# Patient Record
Sex: Male | Born: 1950
Health system: Southern US, Community
[De-identification: ages and names within clinical notes are randomized; demographics above are authoritative.]

## PROBLEM LIST (undated history)

## (undated) DIAGNOSIS — I10 Essential (primary) hypertension: Secondary | ICD-10-CM

## (undated) DIAGNOSIS — N189 Chronic kidney disease, unspecified: Secondary | ICD-10-CM

## (undated) DIAGNOSIS — M1990 Unspecified osteoarthritis, unspecified site: Secondary | ICD-10-CM

## (undated) DIAGNOSIS — G4730 Sleep apnea, unspecified: Secondary | ICD-10-CM

## (undated) HISTORY — PX: COLONOSCOPY: SHX174

---

## 2009-12-05 ENCOUNTER — Encounter: Admission: RE | Admit: 2009-12-05 | Discharge: 2009-12-05 | Payer: Self-pay | Admitting: Family Medicine

## 2013-11-06 ENCOUNTER — Ambulatory Visit (HOSPITAL_BASED_OUTPATIENT_CLINIC_OR_DEPARTMENT_OTHER): Payer: BC Managed Care – PPO | Attending: Family Medicine

## 2013-11-06 VITALS — Ht 70.0 in | Wt 180.0 lb

## 2013-11-06 DIAGNOSIS — G4733 Obstructive sleep apnea (adult) (pediatric): Secondary | ICD-10-CM | POA: Diagnosis present

## 2013-11-12 DIAGNOSIS — G4733 Obstructive sleep apnea (adult) (pediatric): Secondary | ICD-10-CM

## 2013-11-12 NOTE — Sleep Study (Signed)
   NAME: Ryan Rubio DATE OF BIRTH:  04-18-1950 MEDICAL RECORD NUMBER [id number]  LOCATION: [facility]  PHYSICIAN: YOUNG,CLINTON D  DATE OF STUDY: 11/06/2013  SLEEP STUDY TYPE: Nocturnal Polysomnogram               REFERRING PHYSICIAN: Marjorie Smolder, MD  INDICATION FOR STUDY: Insomnia with sleep apnea  EPWORTH SLEEPINESS SCORE:   11/24  HEIGHT: 5\' 10"  (177.8 cm)  WEIGHT: 81.647 kg (180 lb)    Body mass index is 25.83 kg/(m^2).  NECK SIZE: 15 in.  MEDICATIONS: Charted for review  SLEEP ARCHITECTURE: Split study protocol. During the diagnostic phase, total sleep time 120.5 minutes with sleep efficiency 78.2%. Stage I was 6.2%, stage II 75.1%, stage III absent, REM 18.7% of total sleep time. Sleep latency 6.5 minutes, REM latency 125 minutes, awake after sleep onset 27 minutes, arousal index 4.5, bedtime medication: None  RESPIRATORY DATA: Apnea hypopneas index (AHI) 37.3 per hour. 75 total events scored including 23 obstructive apneas, 2 central apneas, 50 hypopneas. Events were associated with supine sleep position and REM AHI 74.7 per hour. CPAP titration to 14 CWP, AHI 3.7 per hour. He wore a medium fullface mask.  OXYGEN DATA: Before CPAP snoring was loud with oxygen desaturation to a nadir of 72% on room air. With CPAP control, snoring was prevented and mean oxygen sat 94.7% on room air.  CARDIAC DATA: Sinus rhythm  MOVEMENT/PARASOMNIA: Limb jerks were noted before and after CPAP titration, but had little apparent effect on sleep,  bathroom x2  IMPRESSION/ RECOMMENDATION:   1) Severe obstructive sleep apnea/hypopneas syndrome, AHI 37.3 per hour with supine events. Loud snoring with oxygen desaturation to a nadir of 72% on room air. 2) Successful CPAP titration to 14 CWP, AHI 3.7 per hour. He wore a medium [employer] Simplus fullface mask with heated humidifier. Snoring was prevented and mean oxygen saturation was 94.7% on room air.    Ryan Rubio Diplomate, American Board of Sleep Medicine  ELECTRONICALLY SIGNED ON:  11/12/2013, 2:27 PM [place] PH: [phone]   FX: [phone] [place]

## 2014-09-04 DIAGNOSIS — N5201 Erectile dysfunction due to arterial insufficiency: Secondary | ICD-10-CM | POA: Diagnosis not present

## 2014-09-04 DIAGNOSIS — Z23 Encounter for immunization: Secondary | ICD-10-CM | POA: Diagnosis not present

## 2014-09-04 DIAGNOSIS — G4733 Obstructive sleep apnea (adult) (pediatric): Secondary | ICD-10-CM | POA: Diagnosis not present

## 2014-09-04 DIAGNOSIS — Z131 Encounter for screening for diabetes mellitus: Secondary | ICD-10-CM | POA: Diagnosis not present

## 2014-09-04 DIAGNOSIS — E785 Hyperlipidemia, unspecified: Secondary | ICD-10-CM | POA: Diagnosis not present

## 2014-09-04 DIAGNOSIS — Z0000 Encounter for general adult medical examination without abnormal findings: Secondary | ICD-10-CM | POA: Diagnosis not present

## 2014-09-06 ENCOUNTER — Other Ambulatory Visit: Payer: Self-pay | Admitting: Family Medicine

## 2014-09-06 DIAGNOSIS — Z0000 Encounter for general adult medical examination without abnormal findings: Secondary | ICD-10-CM

## 2014-09-18 ENCOUNTER — Ambulatory Visit
Admission: RE | Admit: 2014-09-18 | Discharge: 2014-09-18 | Disposition: A | Discharge status: Home / Self Care | Payer: Medicare Other | Source: Ambulatory Visit | Attending: Family Medicine | Admitting: Family Medicine

## 2014-09-18 DIAGNOSIS — Z87891 Personal history of nicotine dependence: Secondary | ICD-10-CM | POA: Diagnosis not present

## 2014-09-18 DIAGNOSIS — Z0000 Encounter for general adult medical examination without abnormal findings: Secondary | ICD-10-CM

## 2014-09-18 DIAGNOSIS — F17211 Nicotine dependence, cigarettes, in remission: Secondary | ICD-10-CM | POA: Diagnosis not present

## 2014-09-18 DIAGNOSIS — Z136 Encounter for screening for cardiovascular disorders: Secondary | ICD-10-CM | POA: Diagnosis not present

## 2014-10-19 DIAGNOSIS — M9903 Segmental and somatic dysfunction of lumbar region: Secondary | ICD-10-CM | POA: Diagnosis not present

## 2014-10-19 DIAGNOSIS — M5441 Lumbago with sciatica, right side: Secondary | ICD-10-CM | POA: Diagnosis not present

## 2014-10-24 DIAGNOSIS — M5441 Lumbago with sciatica, right side: Secondary | ICD-10-CM | POA: Diagnosis not present

## 2014-10-24 DIAGNOSIS — M9903 Segmental and somatic dysfunction of lumbar region: Secondary | ICD-10-CM | POA: Diagnosis not present

## 2014-10-26 DIAGNOSIS — M5441 Lumbago with sciatica, right side: Secondary | ICD-10-CM | POA: Diagnosis not present

## 2014-10-26 DIAGNOSIS — M9903 Segmental and somatic dysfunction of lumbar region: Secondary | ICD-10-CM | POA: Diagnosis not present

## 2014-10-31 DIAGNOSIS — M9903 Segmental and somatic dysfunction of lumbar region: Secondary | ICD-10-CM | POA: Diagnosis not present

## 2014-10-31 DIAGNOSIS — M5441 Lumbago with sciatica, right side: Secondary | ICD-10-CM | POA: Diagnosis not present

## 2014-11-02 DIAGNOSIS — M9903 Segmental and somatic dysfunction of lumbar region: Secondary | ICD-10-CM | POA: Diagnosis not present

## 2014-11-02 DIAGNOSIS — M5441 Lumbago with sciatica, right side: Secondary | ICD-10-CM | POA: Diagnosis not present

## 2014-11-06 DIAGNOSIS — M9903 Segmental and somatic dysfunction of lumbar region: Secondary | ICD-10-CM | POA: Diagnosis not present

## 2014-11-06 DIAGNOSIS — M5441 Lumbago with sciatica, right side: Secondary | ICD-10-CM | POA: Diagnosis not present

## 2014-11-09 DIAGNOSIS — M9903 Segmental and somatic dysfunction of lumbar region: Secondary | ICD-10-CM | POA: Diagnosis not present

## 2014-11-09 DIAGNOSIS — M5441 Lumbago with sciatica, right side: Secondary | ICD-10-CM | POA: Diagnosis not present

## 2014-11-23 DIAGNOSIS — M1711 Unilateral primary osteoarthritis, right knee: Secondary | ICD-10-CM | POA: Diagnosis not present

## 2014-12-14 DIAGNOSIS — E7800 Pure hypercholesterolemia, unspecified: Secondary | ICD-10-CM | POA: Diagnosis not present

## 2014-12-14 DIAGNOSIS — R7301 Impaired fasting glucose: Secondary | ICD-10-CM | POA: Diagnosis not present

## 2014-12-22 DIAGNOSIS — M1711 Unilateral primary osteoarthritis, right knee: Secondary | ICD-10-CM | POA: Diagnosis not present

## 2015-03-01 DIAGNOSIS — M170 Bilateral primary osteoarthritis of knee: Secondary | ICD-10-CM | POA: Diagnosis not present

## 2015-03-01 DIAGNOSIS — M1712 Unilateral primary osteoarthritis, left knee: Secondary | ICD-10-CM | POA: Diagnosis not present

## 2015-03-06 DIAGNOSIS — E7800 Pure hypercholesterolemia, unspecified: Secondary | ICD-10-CM | POA: Diagnosis not present

## 2015-04-02 DIAGNOSIS — M170 Bilateral primary osteoarthritis of knee: Secondary | ICD-10-CM | POA: Diagnosis not present

## 2015-07-17 DIAGNOSIS — M1711 Unilateral primary osteoarthritis, right knee: Secondary | ICD-10-CM | POA: Diagnosis not present

## 2015-07-23 DIAGNOSIS — M1711 Unilateral primary osteoarthritis, right knee: Secondary | ICD-10-CM | POA: Diagnosis not present

## 2015-08-01 DIAGNOSIS — M1711 Unilateral primary osteoarthritis, right knee: Secondary | ICD-10-CM | POA: Diagnosis not present

## 2015-09-12 DIAGNOSIS — M1711 Unilateral primary osteoarthritis, right knee: Secondary | ICD-10-CM | POA: Diagnosis not present

## 2015-11-22 DIAGNOSIS — E7800 Pure hypercholesterolemia, unspecified: Secondary | ICD-10-CM | POA: Diagnosis not present

## 2015-11-22 DIAGNOSIS — Z23 Encounter for immunization: Secondary | ICD-10-CM | POA: Diagnosis not present

## 2015-11-22 DIAGNOSIS — G4730 Sleep apnea, unspecified: Secondary | ICD-10-CM | POA: Diagnosis not present

## 2015-11-22 DIAGNOSIS — N5201 Erectile dysfunction due to arterial insufficiency: Secondary | ICD-10-CM | POA: Diagnosis not present

## 2015-11-22 DIAGNOSIS — Z125 Encounter for screening for malignant neoplasm of prostate: Secondary | ICD-10-CM | POA: Diagnosis not present

## 2015-11-22 DIAGNOSIS — Z5181 Encounter for therapeutic drug level monitoring: Secondary | ICD-10-CM | POA: Diagnosis not present

## 2015-11-22 DIAGNOSIS — M170 Bilateral primary osteoarthritis of knee: Secondary | ICD-10-CM | POA: Diagnosis not present

## 2015-12-28 DIAGNOSIS — R740 Nonspecific elevation of levels of transaminase and lactic acid dehydrogenase [LDH]: Secondary | ICD-10-CM | POA: Diagnosis not present

## 2015-12-28 DIAGNOSIS — E7800 Pure hypercholesterolemia, unspecified: Secondary | ICD-10-CM | POA: Diagnosis not present

## 2015-12-28 DIAGNOSIS — R7301 Impaired fasting glucose: Secondary | ICD-10-CM | POA: Diagnosis not present

## 2016-01-02 DIAGNOSIS — G4733 Obstructive sleep apnea (adult) (pediatric): Secondary | ICD-10-CM | POA: Diagnosis not present

## 2016-01-07 DIAGNOSIS — H903 Sensorineural hearing loss, bilateral: Secondary | ICD-10-CM | POA: Diagnosis not present

## 2016-02-20 DIAGNOSIS — H26059 Posterior subcapsular polar infantile and juvenile cataract, unspecified eye: Secondary | ICD-10-CM | POA: Diagnosis not present

## 2016-03-12 DIAGNOSIS — M1711 Unilateral primary osteoarthritis, right knee: Secondary | ICD-10-CM | POA: Diagnosis not present

## 2016-03-17 DIAGNOSIS — Z01818 Encounter for other preprocedural examination: Secondary | ICD-10-CM | POA: Diagnosis not present

## 2016-03-17 DIAGNOSIS — M1710 Unilateral primary osteoarthritis, unspecified knee: Secondary | ICD-10-CM | POA: Diagnosis not present

## 2016-03-21 ENCOUNTER — Ambulatory Visit: Payer: Self-pay | Admitting: Orthopedic Surgery

## 2016-03-25 DIAGNOSIS — M1711 Unilateral primary osteoarthritis, right knee: Secondary | ICD-10-CM | POA: Diagnosis not present

## 2016-03-31 NOTE — Patient Instructions (Signed)
Ryan Rubio  03/31/2016   Your procedure is scheduled on: 04/11/2016    Report to [facility] Main  Entrance take [place]  elevators to 3rd floor to  [place] at    [place] AM.  Call this number if you have problems the morning of surgery [phone]   Remember: ONLY 1 PERSON MAY GO WITH YOU TO SHORT STAY TO GET  READY MORNING OF [place].  Do not eat food or drink liquids :After Midnight.     Take these medicines the morning of surgery with A SIP OF WATER: none                                 You may not have any metal on your body including hair pins and              piercings  Do not wear jewelry,  lotions, powders or perfumes, deodorant                         Men may shave face and neck.   Do not bring valuables to the hospital. [place].  Contacts, dentures or bridgework may not be worn into surgery.  Leave suitcase in the car. After surgery it may be brought to your room.                     Please read over the following fact sheets you were given: _____________________________________________________________________             [address] [facility] - Preparing for Surgery Before surgery, you can play an important role.  Because skin is not sterile, your skin needs to be as free of germs as possible.  You can reduce the number of germs on your skin by washing with CHG (chlorahexidine gluconate) soap before surgery.  CHG is an antiseptic cleaner which kills germs and bonds with the skin to continue killing germs even after washing. Please DO NOT use if you have an allergy to CHG or antibacterial soaps.  If your skin becomes reddened/irritated stop using the CHG and inform your nurse when you arrive at Short Stay. Do not shave (including legs and underarms) for at least 48 hours prior to the first CHG shower.  You may shave your face/neck. Please follow these instructions carefully:  1.  Shower  with CHG Soap the night before surgery and the  morning of Surgery.  2.  If you choose to wash your hair, wash your hair first as usual with your  normal  shampoo.  3.  After you shampoo, rinse your hair and body thoroughly to remove the  shampoo.                           4.  Use CHG as you would any other liquid soap.  You can apply chg directly  to the skin and wash                       Gently with a scrungie or clean washcloth.  5.  Apply the CHG Soap to your body ONLY FROM THE NECK DOWN.  Do not use on face/ open                           Wound or open sores. Avoid contact with eyes, ears mouth and genitals (private parts).                       Wash face,  Genitals (private parts) with your normal soap.             6.  Wash thoroughly, paying special attention to the area where your surgery  will be performed.  7.  Thoroughly rinse your body with warm water from the neck down.  8.  DO NOT shower/wash with your normal soap after using and rinsing off  the CHG Soap.                9.  Pat yourself dry with a clean towel.            10.  Wear clean pajamas.            11.  Place clean sheets on your bed the night of your first shower and do not  sleep with pets. Day of Surgery : Do not apply any lotions/deodorants the morning of surgery.  Please wear clean clothes to the hospital/surgery center.  FAILURE TO FOLLOW THESE INSTRUCTIONS MAY RESULT IN THE CANCELLATION OF YOUR SURGERY PATIENT SIGNATURE_________________________________  NURSE SIGNATURE__________________________________  ________________________________________________________________________  WHAT IS A BLOOD TRANSFUSION? Blood Transfusion Information  A transfusion is the replacement of blood or some of its parts. Blood is made up of multiple cells which provide different functions.  Red blood cells carry oxygen and are used for blood loss replacement.  White blood cells fight against infection.  Platelets control  bleeding.  Plasma helps clot blood.  Other blood products are available for specialized needs, such as hemophilia or other clotting disorders. BEFORE THE TRANSFUSION  Who gives blood for transfusions?   Healthy volunteers who are fully evaluated to make sure their blood is safe. This is blood bank blood. Transfusion therapy is the safest it has ever been in the practice of medicine. Before blood is taken from a donor, a complete history is taken to make sure that person has no history of diseases nor engages in risky social behavior (examples are intravenous drug use or sexual activity with multiple partners). The donor's travel history is screened to minimize risk of transmitting infections, such as malaria. The donated blood is tested for signs of infectious diseases, such as HIV and hepatitis. The blood is then tested to be sure it is compatible with you in order to minimize the chance of a transfusion reaction. If you or a relative donates blood, this is often done in anticipation of surgery and is not appropriate for emergency situations. It takes many days to process the donated blood. RISKS AND COMPLICATIONS Although transfusion therapy is very safe and saves many lives, the main dangers of transfusion include:   Getting an infectious disease.  Developing a transfusion reaction. This is an allergic reaction to something in the blood you were given. Every precaution is taken to prevent this. The decision to have a blood transfusion has been considered carefully by your caregiver before blood is given. Blood is not given unless the benefits outweigh the risks. AFTER THE TRANSFUSION  Right after receiving a blood transfusion, you will usually feel much better and more energetic. This is especially  true if your red blood cells have gotten low (anemic). The transfusion raises the level of the red blood cells which carry oxygen, and this usually causes an energy increase.  The nurse  administering the transfusion will monitor you carefully for complications. HOME CARE INSTRUCTIONS  No special instructions are needed after a transfusion. You may find your energy is better. Speak with your caregiver about any limitations on activity for underlying diseases you may have. SEEK MEDICAL CARE IF:   Your condition is not improving after your transfusion.  You develop redness or irritation at the intravenous (IV) site. SEEK IMMEDIATE MEDICAL CARE IF:  Any of the following symptoms occur over the next 12 hours:  Shaking chills.  You have a temperature by mouth above 102 F (38.9 C), not controlled by medicine.  Chest, back, or muscle pain.  People around you feel you are not acting correctly or are confused.  Shortness of breath or difficulty breathing.  Dizziness and fainting.  You get a rash or develop hives.  You have a decrease in urine output.  Your urine turns a dark color or changes to pink, red, or brown. Any of the following symptoms occur over the next 10 days:  You have a temperature by mouth above 102 F (38.9 C), not controlled by medicine.  Shortness of breath.  Weakness after normal activity.  The white part of the eye turns yellow (jaundice).  You have a decrease in the amount of urine or are urinating less often.  Your urine turns a dark color or changes to pink, red, or brown. Document Released: 02/01/2000 Document Revised: 04/28/2011 Document Reviewed: 09/20/2007 ExitCare Patient Information 2014 [place].  _______________________________________________________________________  Incentive Spirometer  An incentive spirometer is a tool that can help keep your lungs clear and active. This tool measures how well you are filling your lungs with each breath. Taking long deep breaths may help reverse or decrease the chance of developing breathing (pulmonary) problems (especially infection) following:  A long period of time when you are  unable to move or be active. BEFORE THE PROCEDURE   If the spirometer includes an indicator to show your best effort, your nurse or respiratory therapist will set it to a desired goal.  If possible, sit up straight or lean slightly forward. Try not to slouch.  Hold the incentive spirometer in an upright position. INSTRUCTIONS FOR USE  1. Sit on the edge of your bed if possible, or sit up as far as you can in bed or on a chair. 2. Hold the incentive spirometer in an upright position. 3. Breathe out normally. 4. Place the mouthpiece in your mouth and seal your lips tightly around it. 5. Breathe in slowly and as deeply as possible, raising the piston or the ball toward the top of the column. 6. Hold your breath for 3-5 seconds or for as long as possible. Allow the piston or ball to fall to the bottom of the column. 7. Remove the mouthpiece from your mouth and breathe out normally. 8. Rest for a few seconds and repeat Steps 1 through 7 at least 10 times every 1-2 hours when you are awake. Take your time and take a few normal breaths between deep breaths. 9. The spirometer may include an indicator to show your best effort. Use the indicator as a goal to work toward during each repetition. 10. After each set of 10 deep breaths, practice coughing to be sure your lungs are clear. If you have  an incision (the cut made at the time of surgery), support your incision when coughing by placing a pillow or rolled up towels firmly against it. Once you are able to get out of bed, walk around indoors and cough well. You may stop using the incentive spirometer when instructed by your caregiver.  RISKS AND COMPLICATIONS  Take your time so you do not get dizzy or light-headed.  If you are in pain, you may need to take or ask for pain medication before doing incentive spirometry. It is harder to take a deep breath if you are having pain. AFTER USE  Rest and breathe slowly and easily.  It can be helpful to  keep track of a log of your progress. Your caregiver can provide you with a simple table to help with this. If you are using the spirometer at home, follow these instructions: [place] IF:   You are having difficultly using the spirometer.  You have trouble using the spirometer as often as instructed.  Your pain medication is not giving enough relief while using the spirometer.  You develop fever of 100.5 F (38.1 C) or higher. SEEK IMMEDIATE MEDICAL CARE IF:   You cough up bloody sputum that had not been present before.  You develop fever of 102 F (38.9 C) or greater.  You develop worsening pain at or near the incision site. MAKE SURE YOU:   Understand these instructions.  Will watch your condition.  Will get help right away if you are not doing well or get worse. Document Released: 06/16/2006 Document Revised: 04/28/2011 Document Reviewed: 08/17/2006 [facility] Patient Information 2014 [place], [place].   ________________________________________________________________________

## 2016-03-31 NOTE — H&P (Signed)
TOTAL KNEE ADMISSION H&P  Patient is being admitted for right total knee arthroplasty.  Subjective:  Chief Complaint:right knee pain.  HPI: Ryan Rubio, 66 y.o. male, has a history of pain and functional disability in the right knee due to arthritis and has failed non-surgical conservative treatments for greater than 12 weeks to includecorticosteriod injections, flexibility and strengthening excercises, use of assistive devices, weight reduction as appropriate and activity modification.  Onset of symptoms was gradual, starting 3 years ago with gradually worsening course since that time. The patient noted no past surgery on the right knee(s).  Patient currently rates pain in the right knee(s) at 7 out of 10 with activity. Patient has worsening of pain with activity and weight bearing, pain that interferes with activities of daily living, pain with passive range of motion and joint swelling.  Patient has evidence of subchondral sclerosis and joint space narrowing by imaging studies. There is no active infection.  There are no active problems to display for this patient.  No past medical history on file.  No past surgical history on file.  No prescriptions prior to admission.   No Known Allergies  Social History  Substance Use Topics  . Smoking status: Not on file  . Smokeless tobacco: Not on file  . Alcohol use Not on file    No family history on file.   Review of Systems  Constitutional: Negative.   HENT: Negative.   Eyes: Negative.   Respiratory: Negative.   Cardiovascular: Negative.   Gastrointestinal: Negative.   Genitourinary: Negative.   Musculoskeletal: Positive for joint pain.  Skin: Negative.   Neurological: Negative.   Endo/Heme/Allergies: Negative.   Psychiatric/Behavioral: Negative.     Objective:  Physical Exam  Constitutional: He is oriented to person, place, and time. He appears well-developed.  HENT:  Head: Normocephalic.  Eyes: EOM are normal.  Neck:  Normal range of motion.  Cardiovascular: Normal rate and intact distal pulses.   Respiratory: Effort normal and breath sounds normal.  GI: Soft.  Musculoskeletal:  Good ROM. BLE grossly n/v intact.   Neurological: He is alert and oriented to person, place, and time. He has normal reflexes.  Skin: Skin is warm and dry.  Psychiatric: His behavior is normal.    Vital signs in last 24 hours: BP: ()/()  Arterial Line BP: ()/()   Labs:   Estimated body mass index is 25.83 kg/m as calculated from the following:   Height as of 11/06/13: 5\' 10"  (1.778 m).   Weight as of 11/06/13: 81.6 kg (180 lb).   Imaging Review Plain radiographs demonstrate moderate degenerative joint disease of the right knee(s). The overall alignment ismild varus. The bone quality appears to be good for age and reported activity level.  Assessment/Plan:  End stage arthritis, right knee   The patient history, physical examination, clinical judgment of the provider and imaging studies are consistent with end stage degenerative joint disease of the right knee(s) and total knee arthroplasty is deemed medically necessary. The treatment options including medical management, injection therapy arthroscopy and arthroplasty were discussed at length. The risks and benefits of total knee arthroplasty were presented and reviewed. The risks due to aseptic loosening, infection, stiffness, patella tracking problems, thromboembolic complications and other imponderables were discussed. The patient acknowledged the explanation, agreed to proceed with the plan and consent was signed. Patient is being admitted for inpatient treatment for surgery, pain control, PT, OT, prophylactic antibiotics, VTE prophylaxis, progressive ambulation and ADL's and discharge planning. The patient is  planning to be discharged home with OPPT.  Will use IV tranexamic acid. Contraindications and adverse affects of Tranexamic acid discussed in detail. Patient denies  any of these at this time and understands the risks and benefits.

## 2016-04-01 ENCOUNTER — Encounter (HOSPITAL_COMMUNITY)
Admission: RE | Admit: 2016-04-01 | Discharge: 2016-04-01 | Disposition: A | Discharge status: Home / Self Care | Payer: Medicare Other | Source: Ambulatory Visit | Attending: Specialist | Admitting: Specialist

## 2016-04-01 ENCOUNTER — Encounter (HOSPITAL_COMMUNITY): Payer: Self-pay

## 2016-04-01 DIAGNOSIS — Z01812 Encounter for preprocedural laboratory examination: Secondary | ICD-10-CM | POA: Diagnosis not present

## 2016-04-01 DIAGNOSIS — M1711 Unilateral primary osteoarthritis, right knee: Secondary | ICD-10-CM | POA: Diagnosis not present

## 2016-04-01 HISTORY — DX: Unspecified osteoarthritis, unspecified site: M19.90

## 2016-04-01 HISTORY — DX: Sleep apnea, unspecified: G47.30

## 2016-04-01 LAB — BASIC METABOLIC PANEL
Anion gap: 7 (ref 5–15)
BUN: 16 mg/dL (ref 6–20)
CO2: 26 mmol/L (ref 22–32)
Calcium: 9.8 mg/dL (ref 8.9–10.3)
Chloride: 107 mmol/L (ref 101–111)
Creatinine, Ser: 1.14 mg/dL (ref 0.61–1.24)
GFR calc Af Amer: >60 mL/min (ref >60)
GFR calc non Af Amer: >60 mL/min (ref >60)
Glucose, Bld: 103 mg/dL — ABNORMAL HIGH (ref 65–99)
Potassium: 4.4 mmol/L (ref 3.5–5.1)
Sodium: 140 mmol/L (ref 135–145)

## 2016-04-01 LAB — URINALYSIS, ROUTINE W REFLEX MICROSCOPIC
Bilirubin Urine: NEGATIVE
Glucose, UA: NEGATIVE mg/dL
Hgb urine dipstick: NEGATIVE
Ketones, ur: NEGATIVE mg/dL
Leukocytes, UA: NEGATIVE
Nitrite: NEGATIVE
Protein, ur: NEGATIVE mg/dL
Specific Gravity, Urine: 1.009 (ref 1.005–1.030)
pH: 5 (ref 5.0–8.0)

## 2016-04-01 LAB — APTT: aPTT: 31 seconds (ref 24–36)

## 2016-04-01 LAB — CBC
HCT: 43.7 % (ref 39.0–52.0)
Hemoglobin: 15.1 g/dL (ref 13.0–17.0)
MCH: 31.3 pg (ref 26.0–34.0)
MCHC: 34.6 g/dL (ref 30.0–36.0)
MCV: 90.7 fL (ref 78.0–100.0)
Platelets: 175 10*3/uL (ref 150–400)
RBC: 4.82 MIL/uL (ref 4.22–5.81)
RDW: 12.9 % (ref 11.5–15.5)
WBC: 7.2 10*3/uL (ref 4.0–10.5)

## 2016-04-01 LAB — SURGICAL PCR SCREEN
MRSA, PCR: NEGATIVE
Staphylococcus aureus: NEGATIVE

## 2016-04-01 LAB — PROTIME-INR
INR: 1.11
Prothrombin Time: 14.3 seconds (ref 11.4–15.2)

## 2016-04-01 LAB — ABO/RH: ABO/RH(D): O POS

## 2016-04-11 ENCOUNTER — Inpatient Hospital Stay (HOSPITAL_COMMUNITY): Payer: Medicare Other | Admitting: Anesthesiology

## 2016-04-11 ENCOUNTER — Encounter (HOSPITAL_COMMUNITY)
Admission: RE | Disposition: A | Discharge status: Home Health | Payer: Self-pay | Source: Ambulatory Visit | Attending: Specialist

## 2016-04-11 ENCOUNTER — Inpatient Hospital Stay (HOSPITAL_COMMUNITY)
Admission: RE | Admit: 2016-04-11 | Discharge: 2016-04-13 | DRG: 470 | Disposition: A | Discharge status: Home Health | Payer: Medicare Other | Source: Ambulatory Visit | Attending: Specialist | Admitting: Specialist

## 2016-04-11 ENCOUNTER — Encounter (HOSPITAL_COMMUNITY): Payer: Self-pay | Admitting: Anesthesiology

## 2016-04-11 DIAGNOSIS — Z96659 Presence of unspecified artificial knee joint: Secondary | ICD-10-CM

## 2016-04-11 DIAGNOSIS — G4730 Sleep apnea, unspecified: Secondary | ICD-10-CM | POA: Diagnosis not present

## 2016-04-11 DIAGNOSIS — G8918 Other acute postprocedural pain: Secondary | ICD-10-CM | POA: Diagnosis not present

## 2016-04-11 DIAGNOSIS — M1711 Unilateral primary osteoarthritis, right knee: Secondary | ICD-10-CM | POA: Diagnosis not present

## 2016-04-11 DIAGNOSIS — Z96651 Presence of right artificial knee joint: Secondary | ICD-10-CM

## 2016-04-11 DIAGNOSIS — Z87891 Personal history of nicotine dependence: Secondary | ICD-10-CM | POA: Diagnosis not present

## 2016-04-11 HISTORY — PX: TOTAL KNEE ARTHROPLASTY: SHX125

## 2016-04-11 LAB — TYPE AND SCREEN
ABO/RH(D): O POS
Antibody Screen: NEGATIVE

## 2016-04-11 SURGERY — ARTHROPLASTY, KNEE, TOTAL
Anesthesia: General | Site: Knee | Laterality: Right

## 2016-04-11 MED ORDER — DEXTROSE 5 % IV SOLN
500.0000 mg | Freq: Four times a day (QID) | INTRAVENOUS | Status: DC | PRN
  Administered 2016-04-11: 500 mg via INTRAVENOUS
  Filled 2016-04-11: qty 550
  Filled 2016-04-11: qty 5

## 2016-04-11 MED ORDER — ONDANSETRON HCL 4 MG/2ML IJ SOLN
INTRAMUSCULAR | Status: AC
Start: 2016-04-11 — End: 2016-04-11
  Filled 2016-04-11: qty 2

## 2016-04-11 MED ORDER — DIPHENHYDRAMINE HCL 12.5 MG/5ML PO ELIX: 12.5000 mg | ORAL_SOLUTION | ORAL | Status: DC | PRN

## 2016-04-11 MED ORDER — MENTHOL 3 MG MT LOZG: 1.0000 | LOZENGE | OROMUCOSAL | Status: DC | PRN

## 2016-04-11 MED ORDER — PROMETHAZINE HCL 25 MG/ML IJ SOLN: 6.2500 mg | INTRAMUSCULAR | Status: DC | PRN

## 2016-04-11 MED ORDER — DEXAMETHASONE SODIUM PHOSPHATE 10 MG/ML IJ SOLN
INTRAMUSCULAR | Status: AC
  Filled 2016-04-11: qty 1

## 2016-04-11 MED ORDER — ONDANSETRON HCL 4 MG/2ML IJ SOLN
INTRAMUSCULAR | Status: DC | PRN
  Administered 2016-04-11: 4 mg via INTRAVENOUS

## 2016-04-11 MED ORDER — ACETAMINOPHEN 650 MG RE SUPP: 650.0000 mg | Freq: Four times a day (QID) | RECTAL | Status: DC | PRN

## 2016-04-11 MED ORDER — PHENOL 1.4 % MT LIQD: 1.0000 | OROMUCOSAL | Status: DC | PRN

## 2016-04-11 MED ORDER — BUPIVACAINE IN DEXTROSE 0.75-8.25 % IT SOLN
INTRATHECAL | Status: DC | PRN
  Administered 2016-04-11: 2 mL via INTRATHECAL

## 2016-04-11 MED ORDER — OXYCODONE HCL 5 MG PO TABS: 5.0000 mg | ORAL_TABLET | ORAL | 0 refills | Status: DC | PRN

## 2016-04-11 MED ORDER — MIDAZOLAM HCL 2 MG/2ML IJ SOLN
INTRAMUSCULAR | Status: AC
  Filled 2016-04-11: qty 2

## 2016-04-11 MED ORDER — METOCLOPRAMIDE HCL 5 MG/ML IJ SOLN: 5.0000 mg | Freq: Three times a day (TID) | INTRAMUSCULAR | Status: DC | PRN

## 2016-04-11 MED ORDER — POVIDONE-IODINE 7.5 % EX SOLN: Freq: Once | CUTANEOUS | Status: DC

## 2016-04-11 MED ORDER — CEFAZOLIN SODIUM-DEXTROSE 2-4 GM/100ML-% IV SOLN
2.0000 g | Freq: Four times a day (QID) | INTRAVENOUS | Status: AC
  Administered 2016-04-11 (×2): 2 g via INTRAVENOUS
  Filled 2016-04-11 (×2): qty 100

## 2016-04-11 MED ORDER — KETOROLAC TROMETHAMINE 30 MG/ML IJ SOLN
INTRAMUSCULAR | Status: DC | PRN
  Administered 2016-04-11: 30 mg

## 2016-04-11 MED ORDER — HYDROMORPHONE HCL 1 MG/ML IJ SOLN
1.0000 mg | INTRAMUSCULAR | Status: DC | PRN
  Administered 2016-04-11: 1 mg via INTRAVENOUS
  Filled 2016-04-11 (×2): qty 1

## 2016-04-11 MED ORDER — BUPIVACAINE HCL (PF) 0.25 % IJ SOLN
INTRAMUSCULAR | Status: AC
  Filled 2016-04-11: qty 30

## 2016-04-11 MED ORDER — ONDANSETRON HCL 4 MG PO TABS: 4.0000 mg | ORAL_TABLET | Freq: Four times a day (QID) | ORAL | Status: DC | PRN

## 2016-04-11 MED ORDER — ONDANSETRON HCL 4 MG/2ML IJ SOLN: 4.0000 mg | Freq: Four times a day (QID) | INTRAMUSCULAR | Status: DC | PRN

## 2016-04-11 MED ORDER — METHOCARBAMOL 500 MG PO TABS: 500.0000 mg | ORAL_TABLET | Freq: Three times a day (TID) | ORAL | 2 refills | Status: DC | PRN

## 2016-04-11 MED ORDER — PRAVASTATIN SODIUM 20 MG PO TABS
40.0000 mg | ORAL_TABLET | Freq: Every day | ORAL | Status: DC
  Administered 2016-04-12 – 2016-04-13 (×2): 40 mg via ORAL
  Filled 2016-04-11 (×2): qty 2

## 2016-04-11 MED ORDER — PROPOFOL 10 MG/ML IV BOLUS
INTRAVENOUS | Status: DC | PRN
  Administered 2016-04-11: 30 mg via INTRAVENOUS

## 2016-04-11 MED ORDER — ZOLPIDEM TARTRATE 5 MG PO TABS: 5.0000 mg | ORAL_TABLET | Freq: Every evening | ORAL | Status: DC | PRN

## 2016-04-11 MED ORDER — LACTATED RINGERS IV SOLN
INTRAVENOUS | Status: DC
  Administered 2016-04-11 (×3): via INTRAVENOUS

## 2016-04-11 MED ORDER — FLEET ENEMA 7-19 GM/118ML RE ENEM: 1.0000 | ENEMA | Freq: Once | RECTAL | Status: DC | PRN

## 2016-04-11 MED ORDER — STERILE WATER FOR IRRIGATION IR SOLN
Status: DC | PRN
  Administered 2016-04-11: 2000 mL

## 2016-04-11 MED ORDER — MIDAZOLAM HCL 2 MG/2ML IJ SOLN
2.0000 mg | Freq: Once | INTRAMUSCULAR | Status: AC
  Administered 2016-04-11: 2 mg via INTRAVENOUS

## 2016-04-11 MED ORDER — BUPIVACAINE HCL (PF) 0.25 % IJ SOLN
INTRAMUSCULAR | Status: DC | PRN
  Administered 2016-04-11: 30 mL

## 2016-04-11 MED ORDER — EPHEDRINE 5 MG/ML INJ
INTRAVENOUS | Status: AC
  Filled 2016-04-11: qty 10

## 2016-04-11 MED ORDER — ROPIVACAINE HCL 7.5 MG/ML IJ SOLN
INTRAMUSCULAR | Status: DC | PRN
  Administered 2016-04-11: 20 mL via PERINEURAL

## 2016-04-11 MED ORDER — CEFAZOLIN SODIUM-DEXTROSE 2-4 GM/100ML-% IV SOLN
2.0000 g | INTRAVENOUS | Status: AC
  Administered 2016-04-11: 2 g via INTRAVENOUS
  Filled 2016-04-11: qty 100

## 2016-04-11 MED ORDER — METHOCARBAMOL 500 MG PO TABS
500.0000 mg | ORAL_TABLET | Freq: Four times a day (QID) | ORAL | Status: DC | PRN
  Administered 2016-04-13: 500 mg via ORAL
  Filled 2016-04-11: qty 1

## 2016-04-11 MED ORDER — EPHEDRINE SULFATE-NACL 50-0.9 MG/10ML-% IV SOSY
PREFILLED_SYRINGE | INTRAVENOUS | Status: DC | PRN
  Administered 2016-04-11 (×2): 5 mg via INTRAVENOUS

## 2016-04-11 MED ORDER — POTASSIUM CHLORIDE IN NACL 20-0.9 MEQ/L-% IV SOLN
INTRAVENOUS | Status: DC
  Administered 2016-04-11 – 2016-04-12 (×2): via INTRAVENOUS
  Filled 2016-04-11 (×3): qty 1000

## 2016-04-11 MED ORDER — SODIUM CHLORIDE 0.9 % IR SOLN
Status: DC | PRN
  Administered 2016-04-11: 1000 mL

## 2016-04-11 MED ORDER — ALUM & MAG HYDROXIDE-SIMETH 200-200-20 MG/5ML PO SUSP: 30.0000 mL | ORAL | Status: DC | PRN

## 2016-04-11 MED ORDER — TRANEXAMIC ACID 1000 MG/10ML IV SOLN
1000.0000 mg | INTRAVENOUS | Status: AC
  Administered 2016-04-11: 1000 mg via INTRAVENOUS
  Filled 2016-04-11: qty 1100

## 2016-04-11 MED ORDER — OXYCODONE HCL 5 MG PO TABS
5.0000 mg | ORAL_TABLET | ORAL | Status: DC | PRN
  Administered 2016-04-12 – 2016-04-13 (×7): 10 mg via ORAL
  Filled 2016-04-11 (×7): qty 2

## 2016-04-11 MED ORDER — ENOXAPARIN SODIUM 30 MG/0.3ML ~~LOC~~ SOLN
30.0000 mg | Freq: Two times a day (BID) | SUBCUTANEOUS | Status: DC
  Administered 2016-04-12 – 2016-04-13 (×3): 30 mg via SUBCUTANEOUS
  Filled 2016-04-11 (×3): qty 0.3

## 2016-04-11 MED ORDER — HYDROMORPHONE HCL 1 MG/ML IJ SOLN: 0.2500 mg | INTRAMUSCULAR | Status: DC | PRN

## 2016-04-11 MED ORDER — DEXAMETHASONE SODIUM PHOSPHATE 10 MG/ML IJ SOLN
10.0000 mg | Freq: Once | INTRAMUSCULAR | Status: AC
  Administered 2016-04-12: 10 mg via INTRAVENOUS
  Filled 2016-04-11: qty 1

## 2016-04-11 MED ORDER — POLYETHYLENE GLYCOL 3350 17 G PO PACK: 17.0000 g | PACK | Freq: Every day | ORAL | Status: DC | PRN

## 2016-04-11 MED ORDER — SODIUM CHLORIDE 0.9 % IJ SOLN
INTRAMUSCULAR | Status: AC
  Filled 2016-04-11: qty 50

## 2016-04-11 MED ORDER — FERROUS SULFATE 325 (65 FE) MG PO TABS
325.0000 mg | ORAL_TABLET | Freq: Three times a day (TID) | ORAL | Status: DC
  Administered 2016-04-11 – 2016-04-13 (×5): 325 mg via ORAL
  Filled 2016-04-11 (×5): qty 1

## 2016-04-11 MED ORDER — ACETAMINOPHEN 325 MG PO TABS: 650.0000 mg | ORAL_TABLET | Freq: Four times a day (QID) | ORAL | Status: DC | PRN

## 2016-04-11 MED ORDER — DEXAMETHASONE SODIUM PHOSPHATE 10 MG/ML IJ SOLN
10.0000 mg | Freq: Once | INTRAMUSCULAR | Status: AC
  Administered 2016-04-11: 10 mg via INTRAVENOUS

## 2016-04-11 MED ORDER — PHENYLEPHRINE HCL 10 MG/ML IJ SOLN
INTRAMUSCULAR | Status: AC
  Filled 2016-04-11: qty 2

## 2016-04-11 MED ORDER — PROPOFOL 10 MG/ML IV BOLUS
INTRAVENOUS | Status: AC
  Filled 2016-04-11: qty 60

## 2016-04-11 MED ORDER — BISACODYL 5 MG PO TBEC: 5.0000 mg | DELAYED_RELEASE_TABLET | Freq: Every day | ORAL | Status: DC | PRN

## 2016-04-11 MED ORDER — KETOROLAC TROMETHAMINE 30 MG/ML IJ SOLN
INTRAMUSCULAR | Status: AC
  Filled 2016-04-11: qty 1

## 2016-04-11 MED ORDER — ASPIRIN EC 325 MG PO TBEC: 325.0000 mg | DELAYED_RELEASE_TABLET | Freq: Two times a day (BID) | ORAL | 0 refills | Status: DC

## 2016-04-11 MED ORDER — PROPOFOL 500 MG/50ML IV EMUL
INTRAVENOUS | Status: DC | PRN
  Administered 2016-04-11: 75 ug/kg/min via INTRAVENOUS

## 2016-04-11 MED ORDER — METOCLOPRAMIDE HCL 5 MG PO TABS: 5.0000 mg | ORAL_TABLET | Freq: Three times a day (TID) | ORAL | Status: DC | PRN

## 2016-04-11 MED ORDER — SODIUM CHLORIDE 0.9 % IJ SOLN
INTRAMUSCULAR | Status: DC | PRN
  Administered 2016-04-11: 29 mL

## 2016-04-11 MED ORDER — DOCUSATE SODIUM 100 MG PO CAPS
100.0000 mg | ORAL_CAPSULE | Freq: Two times a day (BID) | ORAL | Status: DC
  Administered 2016-04-11 – 2016-04-13 (×4): 100 mg via ORAL
  Filled 2016-04-11 (×4): qty 1

## 2016-04-11 SURGICAL SUPPLY — 62 items
BAG ZIPLOCK 12X15 (MISCELLANEOUS) ×4 IMPLANT
BANDAGE ACE 4X5 VEL STRL LF (GAUZE/BANDAGES/DRESSINGS) ×2 IMPLANT
BANDAGE ACE 6X5 VEL STRL LF (GAUZE/BANDAGES/DRESSINGS) ×2 IMPLANT
BLADE SAG 18X100X1.27 (BLADE) ×2 IMPLANT
BLADE SAW SGTL 13.0X1.19X90.0M (BLADE) ×2 IMPLANT
BOWL SMART MIX CTS (DISPOSABLE) ×2 IMPLANT
CAP KNEE TOTAL 3 SIGMA ×2 IMPLANT
CEMENT HV SMART SET (Cement) ×4 IMPLANT
CLOTH BEACON ORANGE TIMEOUT ST (SAFETY) ×2 IMPLANT
CUFF TOURN SGL QUICK 34 (TOURNIQUET CUFF) ×1
CUFF TRNQT CYL 34X4X40X1 (TOURNIQUET CUFF) ×1 IMPLANT
DECANTER SPIKE VIAL GLASS SM (MISCELLANEOUS) ×2 IMPLANT
DERMABOND ADVANCED (GAUZE/BANDAGES/DRESSINGS) ×1
DERMABOND ADVANCED .7 DNX12 (GAUZE/BANDAGES/DRESSINGS) ×1 IMPLANT
DRAPE U-SHAPE 47X51 STRL (DRAPES) ×2 IMPLANT
DRSG AQUACEL AG ADV 3.5X10 (GAUZE/BANDAGES/DRESSINGS) ×2 IMPLANT
DRSG TEGADERM 4X4.75 (GAUZE/BANDAGES/DRESSINGS) ×2 IMPLANT
DURAPREP 26ML APPLICATOR (WOUND CARE) ×4 IMPLANT
ELECT REM PT RETURN 9FT ADLT (ELECTROSURGICAL) ×2
ELECTRODE REM PT RTRN 9FT ADLT (ELECTROSURGICAL) ×1 IMPLANT
EVACUATOR 1/8 PVC DRAIN (DRAIN) ×2 IMPLANT
GAUZE SPONGE 2X2 8PLY STRL LF (GAUZE/BANDAGES/DRESSINGS) ×1 IMPLANT
GLOVE BIO SURGEON STRL SZ7.5 (GLOVE) ×2 IMPLANT
GLOVE BIOGEL M 7.0 STRL (GLOVE) ×2 IMPLANT
GLOVE BIOGEL PI IND STRL 6.5 (GLOVE) ×1 IMPLANT
GLOVE BIOGEL PI IND STRL 7.5 (GLOVE) ×3 IMPLANT
GLOVE BIOGEL PI IND STRL 8 (GLOVE) ×2 IMPLANT
GLOVE BIOGEL PI INDICATOR 6.5 (GLOVE) ×1
GLOVE BIOGEL PI INDICATOR 7.5 (GLOVE) ×3
GLOVE BIOGEL PI INDICATOR 8 (GLOVE) ×2
GLOVE ECLIPSE 8.0 STRL XLNG CF (GLOVE) ×2 IMPLANT
GLOVE INDICATOR 8.0 STRL GRN (GLOVE) ×2 IMPLANT
GLOVE SURG ORTHO 9.0 STRL STRW (GLOVE) ×2 IMPLANT
GLOVE SURG SS PI 7.5 STRL IVOR (GLOVE) ×6 IMPLANT
GOWN STRL REUS W/TWL LRG LVL3 (GOWN DISPOSABLE) ×2 IMPLANT
GOWN STRL REUS W/TWL XL LVL3 (GOWN DISPOSABLE) ×6 IMPLANT
HANDPIECE INTERPULSE COAX TIP (DISPOSABLE) ×1
IMMOBILIZER KNEE 20 (SOFTGOODS) ×2
IMMOBILIZER KNEE 20 THIGH 36 (SOFTGOODS) ×1 IMPLANT
PACK TOTAL KNEE CUSTOM (KITS) ×2 IMPLANT
POSITIONER SURGICAL ARM (MISCELLANEOUS) ×2 IMPLANT
SET HNDPC FAN SPRY TIP SCT (DISPOSABLE) ×1 IMPLANT
SET PAD KNEE POSITIONER (MISCELLANEOUS) ×2 IMPLANT
SPONGE GAUZE 2X2 STER 10/PKG (GAUZE/BANDAGES/DRESSINGS) ×1
SPONGE LAP 18X18 X RAY DECT (DISPOSABLE) IMPLANT
SPONGE SURGIFOAM ABS GEL 100 (HEMOSTASIS) ×2 IMPLANT
STOCKINETTE 6  STRL (DRAPES) ×1
STOCKINETTE 6 STRL (DRAPES) ×1 IMPLANT
SUCTION FRAZIER HANDLE 12FR (TUBING) ×1
SUCTION TUBE FRAZIER 12FR DISP (TUBING) ×1 IMPLANT
SUT BONE WAX W31G (SUTURE) IMPLANT
SUT MNCRL AB 3-0 PS2 18 (SUTURE) ×2 IMPLANT
SUT VIC AB 1 CT1 27 (SUTURE) ×4
SUT VIC AB 1 CT1 27XBRD ANTBC (SUTURE) ×4 IMPLANT
SUT VIC AB 2-0 CT1 27 (SUTURE) ×2
SUT VIC AB 2-0 CT1 TAPERPNT 27 (SUTURE) ×2 IMPLANT
SUT VLOC 180 0 24IN GS25 (SUTURE) ×2 IMPLANT
SYR 50ML LL SCALE MARK (SYRINGE) ×2 IMPLANT
TAPE STRIPS DRAPE STRL (GAUZE/BANDAGES/DRESSINGS) ×2 IMPLANT
TRAY FOLEY W/METER SILVER 16FR (SET/KITS/TRAYS/PACK) ×2 IMPLANT
WRAP KNEE MAXI GEL POST OP (GAUZE/BANDAGES/DRESSINGS) ×2 IMPLANT
YANKAUER SUCT BULB TIP 10FT TU (MISCELLANEOUS) ×2 IMPLANT

## 2016-04-11 NOTE — Anesthesia Postprocedure Evaluation (Signed)
Anesthesia Post Note  Patient: Ryan Rubio  Procedure(s) Performed: Procedure(s) (LRB): RIGHT TOTAL KNEE ARTHROPLASTY (Right)  Patient location during evaluation: PACU Anesthesia Type: Spinal Level of consciousness: oriented and awake and alert Pain management: pain level controlled Vital Signs Assessment: post-procedure vital signs reviewed and stable Respiratory status: spontaneous breathing, respiratory function stable and patient connected to nasal cannula oxygen Cardiovascular status: blood pressure returned to baseline and stable Postop Assessment: no headache and no backache Anesthetic complications: no       Last Vitals:  Vitals:   04/11/16 1300 04/11/16 1315  BP: 111/65 104/63  Pulse: (!) 51 (!) 50  Resp: 13 (!) 9  Temp:      Last Pain:  Vitals:   04/11/16 1315  TempSrc:   PainSc: 0-No pain    LLE Motor Response: Purposeful movement (04/11/16 1315) LLE Sensation: Decreased (04/11/16 1315) RLE Motor Response: Purposeful movement (04/11/16 1315) RLE Sensation: Decreased (04/11/16 1315)   R Sensory Level: L3-Anterior knee, lower leg (04/11/16 1315)  Deran Barro S

## 2016-04-11 NOTE — Anesthesia Preprocedure Evaluation (Signed)
Anesthesia Evaluation  Patient identified by MRN, date of birth, ID band Patient awake    Reviewed: Allergy & Precautions, NPO status , Patient's Chart, lab work & pertinent test results  Airway Mallampati: II  TM Distance: >3 FB Neck ROM: Full    Dental no notable dental hx.    Pulmonary sleep apnea , former smoker,    Pulmonary exam normal breath sounds clear to auscultation       Cardiovascular negative cardio ROS Normal cardiovascular exam Rhythm:Regular Rate:Normal     Neuro/Psych negative neurological ROS  negative psych ROS   GI/Hepatic negative GI ROS, Neg liver ROS,   Endo/Other  negative endocrine ROS  Renal/GU negative Renal ROS  negative genitourinary   Musculoskeletal negative musculoskeletal ROS (+)   Abdominal   Peds negative pediatric ROS (+)  Hematology negative hematology ROS (+)   Anesthesia Other Findings   Reproductive/Obstetrics negative OB ROS                             Anesthesia Physical Anesthesia Plan  ASA: II  Anesthesia Plan: General and Spinal   Post-op Pain Management:    Induction: Intravenous  Airway Management Planned: Simple Face Mask  Additional Equipment:   Intra-op Plan:   Post-operative Plan:   Informed Consent: I have reviewed the patients History and Physical, chart, labs and discussed the procedure including the risks, benefits and alternatives for the proposed anesthesia with the patient or authorized representative who has indicated his/her understanding and acceptance.   Dental advisory given  Plan Discussed with: CRNA and Surgeon  Anesthesia Plan Comments:         Anesthesia Quick Evaluation

## 2016-04-11 NOTE — Transfer of Care (Signed)
Immediate Anesthesia Transfer of Care Note  Patient: Ryan Rubio  Procedure(s) Performed: Procedure(s) with comments: RIGHT TOTAL KNEE ARTHROPLASTY (Right) - Adductor Block  Patient Location: PACU  Anesthesia Type:Spinal  Level of Consciousness:  sedated, patient cooperative and responds to stimulation  Airway & Oxygen Therapy:Patient Spontanous Breathing and Patient connected to face mask oxgen  Post-op Assessment:  Report given to PACU RN and Post -op Vital signs reviewed and stable  Post vital signs:  Reviewed and stable  Last Vitals:  Vitals:   04/11/16 0955 04/11/16 1000  BP: 119/70   Pulse: (!) 57 63  Resp: 14 15  Temp:      Complications: No apparent anesthesia complications

## 2016-04-11 NOTE — H&P (Signed)
CSW consulted for SNF placement. Pt is part of the medicare bundle program and is planning to return home at d/c and have OP PT.   CSW signing off.  Werner Lean LCSW [phone]

## 2016-04-11 NOTE — Interval H&P Note (Signed)
History and Physical Interval Note:  04/11/2016 9:38 AM  Ryan Rubio  has presented today for surgery, with the diagnosis of Right knee osteoarthritis  The various methods of treatment have been discussed with the patient and family. After consideration of risks, benefits and other options for treatment, the patient has consented to  Procedure(s): RIGHT TOTAL KNEE ARTHROPLASTY (Right) as a surgical intervention .  The patient's history has been reviewed, patient examined, no change in status, stable for surgery.  I have reviewed the patient's chart and labs.  Questions were answered to the patient's satisfaction.     [name] ANDREW

## 2016-04-11 NOTE — Evaluation (Signed)
Physical Therapy Evaluation Patient Details Name: Ryan Rubio MRN: [id number] DOB: March 27, 1950 Today's Date: 04/11/2016   History of Present Illness  Pt s/p R TKR  Clinical Impression  Pt s/p R TKR and presents with decreased R LE strength/ROM and post op pain limiting functional mobility.  Pt should progress to dc home with family assist.    Follow Up Recommendations Outpatient PT    Equipment Recommendations  None recommended by PT    Recommendations for Other Services OT consult     Precautions / Restrictions Precautions Precautions: Knee;Fall Required Braces or Orthoses: Knee Immobilizer - Right Knee Immobilizer - Right: Discontinue once straight leg raise with < 10 degree lag Restrictions Weight Bearing Restrictions: No Other Position/Activity Restrictions: WBAT      Mobility  Bed Mobility Overal bed mobility: Needs Assistance Bed Mobility: Supine to Sit     Supine to sit: Min assist     General bed mobility comments: cues for sequence and use of L LE to self assist  Transfers Overall transfer level: Needs assistance Equipment used: Rolling walker (2 wheeled) Transfers: Sit to/from Stand Sit to Stand: Min assist         General transfer comment: cues for LE management and use of UEs to self assist  Ambulation/Gait Ambulation/Gait assistance: Min assist;+2 safety/equipment Ambulation Distance (Feet): 6 Feet Assistive device: Rolling walker (2 wheeled) Gait Pattern/deviations: Step-to pattern;Decreased step length - right;Decreased step length - left;Shuffle;Trunk flexed Gait velocity: decr Gait velocity interpretation: Below normal speed for age/gender General Gait Details: cues for sequence, posture and position from [employer]            Wheelchair Mobility    Modified Rankin (Stroke Patients Only)       Balance                                             Pertinent Vitals/Pain Pain Assessment: 0-10 Pain  Score: 3  Pain Location: R knee Pain Descriptors / Indicators: Aching;Sore Pain Intervention(s): Limited activity within patient's tolerance;Monitored during session;Premedicated before session;Ice applied    Home Living Family/patient expects to be discharged to:: Private residence Living Arrangements: Spouse/significant other Available Help at Discharge: Family Type of Home: House Home Access: Stairs to enter Entrance Stairs-Rails: Psychiatric nurse of Steps: 5+1+1 Home Layout: One level Home Equipment: Environmental consultant - 2 wheels      Prior Function Level of Independence: Independent               Hand Dominance        Extremity/Trunk Assessment   Upper Extremity Assessment Upper Extremity Assessment: Overall WFL for tasks assessed    Lower Extremity Assessment Lower Extremity Assessment: RLE deficits/detail    Cervical / Trunk Assessment Cervical / Trunk Assessment: Normal  Communication   Communication: No difficulties  Cognition Arousal/Alertness: Awake/alert Behavior During Therapy: WFL for tasks assessed/performed Overall Cognitive Status: Within Functional Limits for tasks assessed                      General Comments      Exercises     Assessment/Plan    PT Assessment Patient needs continued PT services  PT Problem List Decreased strength;Decreased range of motion;Decreased activity tolerance;Decreased mobility;Decreased knowledge of use of DME;Pain       PT Treatment Interventions DME instruction;Gait training;Stair training;Functional mobility training;Therapeutic  activities;Therapeutic exercise;Patient/family education    PT Goals (Current goals can be found in the Care Plan section)  Acute Rehab PT Goals Patient Stated Goal: Regain IND PT Goal Formulation: With patient Time For Goal Achievement: 04/14/16 Potential to Achieve Goals: Good    Frequency 7X/week   Barriers to discharge        Co-evaluation                End of Session Equipment Utilized During Treatment: Gait belt;Right knee immobilizer Activity Tolerance: Patient tolerated treatment well Patient left: in chair;with call bell/phone within reach;with family/visitor present;with chair alarm set Nurse Communication: Mobility status PT Visit Diagnosis: Difficulty in walking, not elsewhere classified (R26.2)         Time: 0263-7858 PT Time Calculation (min) (ACUTE ONLY): 26 min   Charges:   PT Evaluation $PT Eval Low Complexity: 1 Procedure PT Treatments $Gait Training: 8-22 mins   PT G Codes:         Wanita Derenzo 04/11/2016, 5:23 PM

## 2016-04-11 NOTE — Anesthesia Procedure Notes (Signed)
Anesthesia Regional Block: Adductor canal block   Pre-Anesthetic Checklist: ,, timeout performed, Correct Patient, Correct Site, Correct Laterality, Correct Procedure, Correct Position, site marked, Risks and benefits discussed,  Surgical consent,  Pre-op evaluation,  At surgeon's request and post-op pain management  Laterality: Right  Prep: chloraprep       Needles:  Injection technique: Single-shot  Needle Type: Echogenic Needle     Needle Length: 9cm      Additional Needles:   Procedures: ultrasound guided,,,,,,,,  Narrative:  Start time: 04/11/2016 9:40 AM End time: 04/11/2016 9:50 AM Injection made incrementally with aspirations every 5 mL.  Performed by: Personally  Anesthesiologist: Rayhan Groleau  Additional Notes: Patient tolerated the procedure well without complications

## 2016-04-11 NOTE — Op Note (Signed)
DATE OF SURGERY:  04/11/2016  TIME: 12:17 PM  PATIENT NAME:  Ryan Rubio    AGE: 66 y.o.   PRE-OPERATIVE DIAGNOSIS:  Right knee osteoarthritis  POST-OPERATIVE DIAGNOSIS:  Right knee osteoarthritis  PROCEDURE:  Procedure(s): RIGHT TOTAL KNEE ARTHROPLASTY  SURGEON:  Samba Cumba ANDREW  ASSISTANT:  Bryson Stilwell, PA-C, present and scrubbed throughout the case, critical for assistance with exposure, retraction, instrumentation, and closure.  OPERATIVE IMPLANTS: Depuy [employer] Sigma Rotating Platform.  Femur size 4, Tibia size 4, Patella size 38 3-peg oval button, with a 10 mm polyethylene insert.   PREOPERATIVE INDICATIONS:   GLENWOOD REVOIR is a 66 y.o. year old male with end stage bone on bone arthritis of the knee who failed conservative treatment and elected for Total Knee Arthroplasty.   The risks, benefits, and alternatives were discussed at length including but not limited to the risks of infection, bleeding, nerve injury, stiffness, blood clots, the need for revision surgery, cardiopulmonary complications, among others, and they were willing to proceed.  OPERATIVE DESCRIPTION:  The patient was brought to the operative room and placed in a supine position.  Spinal anesthesia was administered.  IV antibiotics were given.  The lower extremity was prepped and draped in the usual sterile fashion.  Time out was performed.  The leg was elevated and exsanguinated and the tourniquet was inflated.  Anterior quadriceps tendon splitting approach was performed.  The patella was retracted and osteophytes were removed.  The anterior horn of the medial and lateral meniscus was removed and cruciate ligaments resected.   The distal femur was opened with the drill and the intramedullary distal femoral cutting jig was utilized, set at 5 degrees resecting 10 mm off the distal femur.  Care was taken to protect the collateral ligaments.  The distal femoral sizing jig was applied, taking care to  avoid notching.  Then the 4-in-1 cutting jig was applied and the anterior and posterior femur was cut, along with the chamfer cuts.    Then the extramedullary tibial cutting jig was utilized making the appropriate cut using the anterior tibial crest as a reference building in appropriate posterior slope.  Care was taken during the cut to protect the medial and collateral ligaments.  The proximal tibia was removed along with the posterior horns of the menisci.   The posterior medial femoral osteophytes and posterior lateral femoral osteophytes were removed.    The flexion gap was then measured and was symmetric with the extension gap, measured at 10.  I completed the distal femoral preparation using the appropriate jig to prepare the box.  The patella was then measured, and cut with the saw.    The proximal tibia sized and prepared accordingly with the reamer and the punch, and then all components were trialed with the trial insert.  The knee was found to have excellent balance and full motion.    The above named components were then cemented into place and all excess cement was removed.  The trial polyethylene component was in place during cementation, and then was exchanged for the real polyethylene component.    The knee was easily taken through a range of motion and the patella tracked well and the knee irrigated copiously and the parapatellar and subcutaneous tissue closed with vicryl, and monocryl with steri strips for the skin.  The arthrotomy was closed at 90 of flexion. The wounds were dressed with sterile gauze and the tourniquet released and the patient was awakened and returned to the PACU  in stable and satisfactory condition.  There were no complications.  Total tourniquet time was 90 minutes.

## 2016-04-11 NOTE — Progress Notes (Signed)
Assisted Dr. Rose with right, ultrasound guided, adductor canal block. Side rails up, monitors on throughout procedure. See vital signs in flow sheet. Tolerated Procedure well.  

## 2016-04-11 NOTE — Anesthesia Procedure Notes (Signed)
Spinal  Patient location during procedure: OR Start time: 04/11/2016 10:13 AM End time: 04/11/2016 10:24 AM Staffing Resident/CRNA: Capria Cartaya Performed: resident/CRNA  Preanesthetic Checklist Completed: patient identified, site marked, surgical consent, pre-op evaluation, timeout performed, IV checked, risks and benefits discussed and monitors and equipment checked Spinal Block Patient position: sitting Prep: Betadine Patient monitoring: heart rate, cardiac monitor, continuous pulse ox and blood pressure Approach: midline Location: L3-4 Injection technique: single-shot Needle Needle type: Pencan  Needle gauge: 24 G Needle length: 9 cm Needle insertion depth: 7 cm Assessment Sensory level: T6 Additional Notes -heme, -para, VSS Pt tolerated well.  Lot and exp date OK.

## 2016-04-12 LAB — CBC
HCT: 35.3 % — ABNORMAL LOW (ref 39.0–52.0)
Hemoglobin: 12.2 g/dL — ABNORMAL LOW (ref 13.0–17.0)
MCH: 31.1 pg (ref 26.0–34.0)
MCHC: 34.6 g/dL (ref 30.0–36.0)
MCV: 90.1 fL (ref 78.0–100.0)
Platelets: 150 10*3/uL (ref 150–400)
RBC: 3.92 MIL/uL — ABNORMAL LOW (ref 4.22–5.81)
RDW: 12.7 % (ref 11.5–15.5)
WBC: 12.4 10*3/uL — ABNORMAL HIGH (ref 4.0–10.5)

## 2016-04-12 LAB — BASIC METABOLIC PANEL
Anion gap: 6 (ref 5–15)
BUN: 16 mg/dL (ref 6–20)
CO2: 25 mmol/L (ref 22–32)
Calcium: 8.8 mg/dL — ABNORMAL LOW (ref 8.9–10.3)
Chloride: 108 mmol/L (ref 101–111)
Creatinine, Ser: 1.02 mg/dL (ref 0.61–1.24)
GFR calc Af Amer: >60 mL/min (ref >60)
GFR calc non Af Amer: >60 mL/min (ref >60)
Glucose, Bld: 141 mg/dL — ABNORMAL HIGH (ref 65–99)
Potassium: 4.6 mmol/L (ref 3.5–5.1)
Sodium: 139 mmol/L (ref 135–145)

## 2016-04-12 NOTE — Progress Notes (Signed)
Physical Therapy Treatment Patient Details Name: Ryan Rubio MRN: [id number] DOB: December 21, 1950 Today's Date: 04/12/2016    History of Present Illness Pt s/p R TKR    PT Comments    Pt progressing well and hopeful for dc home tomorrow.   Follow Up Recommendations  Outpatient PT     Equipment Recommendations  None recommended by PT    Recommendations for Other Services OT consult     Precautions / Restrictions Precautions Precautions: Knee;Fall Required Braces or Orthoses: Knee Immobilizer - Right Knee Immobilizer - Right: Discontinue once straight leg raise with < 10 degree lag Restrictions Weight Bearing Restrictions: No Other Position/Activity Restrictions: WBAT    Mobility  Bed Mobility Overal bed mobility: Needs Assistance Bed Mobility: Sit to Supine       Sit to supine: Min assist   General bed mobility comments: cues for sequence and assist for R LE  Transfers Overall transfer level: Needs assistance Equipment used: Rolling walker (2 wheeled) Transfers: Sit to/from Stand Sit to Stand: Min guard         General transfer comment: cues for LE management and use of UEs to self assist  Ambulation/Gait Ambulation/Gait assistance: Min guard Ambulation Distance (Feet): 125 Feet Assistive device: Rolling walker (2 wheeled) Gait Pattern/deviations: Step-to pattern;Decreased step length - right;Decreased step length - left;Shuffle;Trunk flexed Gait velocity: decr Gait velocity interpretation: Below normal speed for age/gender General Gait Details: min cues for posture and position from [employer]            Wheelchair Mobility    Modified Rankin (Stroke Patients Only)       Balance                                    Cognition Arousal/Alertness: Awake/alert Behavior During Therapy: WFL for tasks assessed/performed Overall Cognitive Status: Within Functional Limits for tasks assessed                       Exercises Total Joint Exercises Ankle Circles/Pumps: AROM;Both;15 reps;Supine Quad Sets: AROM;Both;10 reps;Supine Heel Slides: AAROM;Right;15 reps;Supine Straight Leg Raises: Right;Supine;15 reps;AROM;AAROM Goniometric ROM: AAROM R knee -10 - 75    General Comments        Pertinent Vitals/Pain Pain Assessment: 0-10 Pain Score: 5  Pain Location: R knee Pain Descriptors / Indicators: Aching;Sore Pain Intervention(s): Limited activity within patient's tolerance;Monitored during session;Premedicated before session;Ice applied    Home Living                      Prior Function            PT Goals (current goals can now be found in the care plan section) Acute Rehab PT Goals Patient Stated Goal: Regain IND PT Goal Formulation: With patient Time For Goal Achievement: 04/14/16 Potential to Achieve Goals: Good Progress towards PT goals: Progressing toward goals    Frequency    7X/week      PT Plan Current plan remains appropriate    Co-evaluation             End of Session Equipment Utilized During Treatment: Gait belt;Right knee immobilizer Activity Tolerance: Patient tolerated treatment well Patient left: with call bell/phone within reach;with family/visitor present;with chair alarm set;in bed Nurse Communication: Mobility status PT Visit Diagnosis: Difficulty in walking, not elsewhere classified (R26.2)     Time: 4403-4742 PT Time Calculation (  min) (ACUTE ONLY): 30 min  Charges:  $Gait Training: 8-22 mins $Therapeutic Exercise: 8-22 mins                    G Codes:       Ryan Rubio April 25, 2016, 4:20 PM

## 2016-04-12 NOTE — Progress Notes (Signed)
Subjective: 1 Day Post-Op Procedure(s) (LRB): RIGHT TOTAL KNEE ARTHROPLASTY (Right) Patient reports pain as well controlled.  Progressing with PT. Tolerating Po's. Denies CP, SOB, or calf pain,  Objective: Vital signs in last 24 hours: Temp:  [97.3 F (36.3 C)-98.7 F (37.1 C)] 98.7 F (37.1 C) (02/24 0535) Pulse Rate:  [50-76] 65 (02/24 0535) Resp:  [9-18] 14 (02/24 0535) BP: (104-133)/(50-91) 115/50 (02/24 0535) SpO2:  [96 %-100 %] 98 % (02/24 0535) Weight:  [81.6 kg (180 lb)] 81.6 kg (180 lb) (02/23 0841)  Intake/Output from previous day: 02/23 0701 - 02/24 0700 In: 4040 [P.O.:1540; I.V.:2500] Out: 4165 [Urine:4085; Drains:55; Blood:25] Intake/Output this shift: No intake/output data recorded.   Recent Labs  04/12/16 0435  HGB 12.2*    Recent Labs  04/12/16 0435  WBC 12.4*  RBC 3.92*  HCT 35.3*  PLT 150    Recent Labs  04/12/16 0435  NA 139  K 4.6  CL 108  CO2 25  BUN 16  CREATININE 1.02  GLUCOSE 141*  CALCIUM 8.8*   No results for input(s): LABPT, INR in the last 72 hours. Well nourished. Alert and oriented x3. RRR, Lungs clear, BS x4. Abdomen soft and non tender. Right Calf soft and non tender. Right knee dressing C/D/I. No DVT signs. Compartment soft. No signs of infection.  Right LE grossly neurovascular intact.  Assessment/Plan: 1 Day Post-Op Procedure(s) (LRB): RIGHT TOTAL KNEE ARTHROPLASTY (Right) Up with PT Drain d/c'ed with tip intact D/c ACE wraps later today Plan D/c tomorrow OPPT next week  ASA at d/c  Ryan Rubio 04/12/2016, 7:59 AM

## 2016-04-12 NOTE — Evaluation (Signed)
Occupational Therapy Evaluation Patient Details Name: Ryan Rubio MRN: [id number] DOB: Nov 01, 1950 Today's Date: 04/12/2016    History of Present Illness Pt s/p R TKR   Clinical Impression   Pt admitted with the above diagnoses and presents with below problem list. Pt will benefit from continued acute OT to address the below listed deficits and maximize independence with basic ADLs prior to d/c home. PTA pt was independent with ADLs. Currently min guard to min A with LB ADLs and functional mobility/transfers.       Follow Up Recommendations  No OT follow up    Equipment Recommendations  Other (comment) (3n1 already delivered)    Recommendations for Other Services       Precautions / Restrictions Precautions Precautions: Knee;Fall Precaution Comments: reviewed Required Braces or Orthoses: Knee Immobilizer - Right Knee Immobilizer - Right: Discontinue once straight leg raise with < 10 degree lag Restrictions Weight Bearing Restrictions: No Other Position/Activity Restrictions: WBAT      Mobility Bed Mobility               General bed mobility comments: up in chair  Transfers Overall transfer level: Needs assistance Equipment used: Rolling walker (2 wheeled) Transfers: Sit to/from Stand Sit to Stand: Min guard         General transfer comment: from recliner and 3n1. cues for technique with rw and KI    Balance Overall balance assessment: Needs assistance         Standing balance support: Bilateral upper extremity supported Standing balance-Leahy Scale: Fair Standing balance comment: fair static standing, external support for dynamic                             ADL Overall ADL's : Needs assistance/impaired Eating/Feeding: Set up;Sitting   Grooming: Min guard;Standing   Upper Body Bathing: Set up;Sitting   Lower Body Bathing: Min guard;With adaptive equipment;Sit to/from stand   Upper Body Dressing : Set up;Sitting   Lower Body  Dressing: Min guard;With adaptive equipment;Sit to/from stand   Toilet Transfer: Min guard;Ambulation;RW Toilet Transfer Details (indicate cue type and reason): 3n1 over toilet Toileting- Clothing Manipulation and Hygiene: Min guard   Tub/ Shower Transfer: Walk-in shower;Min guard   Functional mobility during ADLs: Min guard;Rolling walker General ADL Comments: Reviewed strategies for ADLs with knee/fall precautions.     Vision         Perception     Praxis      Pertinent Vitals/Pain Pain Assessment: 0-10 Pain Score: 3  Pain Location: R knee Pain Descriptors / Indicators: Aching;Sore Pain Intervention(s): Limited activity within patient's tolerance;Monitored during session;Repositioned     Hand Dominance     Extremity/Trunk Assessment Upper Extremity Assessment Upper Extremity Assessment: Overall WFL for tasks assessed   Lower Extremity Assessment Lower Extremity Assessment: Defer to PT evaluation   Cervical / Trunk Assessment Cervical / Trunk Assessment: Normal   Communication Communication Communication: No difficulties   Cognition Arousal/Alertness: Awake/alert Behavior During Therapy: WFL for tasks assessed/performed Overall Cognitive Status: Within Functional Limits for tasks assessed                     General Comments       Exercises       Shoulder Instructions      Home Living Family/patient expects to be discharged to:: Private residence Living Arrangements: Spouse/significant other Available Help at Discharge: Family Type of Home: House Home Access: Stairs to  enter Entrance Stairs-Number of Steps: 5+1+1 Entrance Stairs-Rails: Right;Left Home Layout: One level     Bathroom Shower/Tub: Walk-in shower;Other (comment) (handicapped accessible)         Home Equipment: Walker - 2 wheels;Grab bars - tub/shower;Hand held shower head   Additional Comments: BSC and rw already delivered to pt      Prior Functioning/Environment Level  of Independence: Independent                 OT Problem List: Impaired balance (sitting and/or standing);Decreased knowledge of use of DME or AE;Decreased knowledge of precautions;Pain      OT Treatment/Interventions:      OT Goals(Current goals can be found in the care plan section) Acute Rehab OT Goals Patient Stated Goal: Regain IND OT Goal Formulation: With patient Time For Goal Achievement: 04/19/16 Potential to Achieve Goals: Good ADL Goals Pt Will Perform Lower Body Bathing: with modified independence Pt Will Perform Lower Body Dressing: with modified independence Pt Will Perform Tub/Shower Transfer: with supervision;ambulating;shower seat;3 in 1;rolling walker  OT Frequency: Min 1X/week   Barriers to D/C:            Co-evaluation              End of Session Equipment Utilized During Treatment: Rolling walker;Right knee immobilizer  Activity Tolerance: Patient tolerated treatment well Patient left: in chair;with call bell/phone within reach  OT Visit Diagnosis: Unsteadiness on feet (R26.81);Other abnormalities of gait and mobility (R26.89);Pain Pain - Right/Left: Right Pain - part of body: Knee                ADL either performed or assessed with clinical judgement  Time: 1001-1023 OT Time Calculation (min): 22 min Charges:  OT General Charges $OT Visit: 1 Procedure OT Evaluation $OT Eval Low Complexity: 1 Procedure G-Codes:       Hortencia Pilar 04/12/2016, 10:33 AM

## 2016-04-12 NOTE — Progress Notes (Signed)
RT helped patient put on his CPAP mask. Patient is tolerating well .

## 2016-04-12 NOTE — Progress Notes (Signed)
Physical Therapy Treatment Patient Details Name: Ryan Rubio MRN: [id number] DOB: 1950/10/26 Today's Date: 04/12/2016    History of Present Illness Pt s/p R TKR    PT Comments    Pt motivated and progressing well with mobility.  Therex program initiated this am.   Follow Up Recommendations  Outpatient PT     Equipment Recommendations  None recommended by PT    Recommendations for Other Services OT consult     Precautions / Restrictions Precautions Precautions: Knee;Fall Precaution Comments: reviewed Required Braces or Orthoses: Knee Immobilizer - Right Knee Immobilizer - Right: Discontinue once straight leg raise with < 10 degree lag Restrictions Weight Bearing Restrictions: No Other Position/Activity Restrictions: WBAT    Mobility  Bed Mobility Overal bed mobility: Needs Assistance Bed Mobility: Supine to Sit     Supine to sit: Min assist     General bed mobility comments: cues for sequence and assist for R LE  Transfers Overall transfer level: Needs assistance Equipment used: Rolling walker (2 wheeled) Transfers: Sit to/from Stand Sit to Stand: Min guard         General transfer comment: cues for LE management and use of UEs to self assist  Ambulation/Gait Ambulation/Gait assistance: Min assist Ambulation Distance (Feet): 74 Feet Assistive device: Rolling walker (2 wheeled) Gait Pattern/deviations: Step-to pattern;Decreased step length - right;Decreased step length - left;Shuffle;Trunk flexed Gait velocity: decr Gait velocity interpretation: Below normal speed for age/gender General Gait Details: cues for sequence, posture and position from [employer]            Wheelchair Mobility    Modified Rankin (Stroke Patients Only)       Balance Overall balance assessment: Needs assistance         Standing balance support: Bilateral upper extremity supported Standing balance-Leahy Scale: Fair Standing balance comment: fair static  standing, external support for dynamic                     Cognition Arousal/Alertness: Awake/alert Behavior During Therapy: WFL for tasks assessed/performed Overall Cognitive Status: Within Functional Limits for tasks assessed                      Exercises Total Joint Exercises Ankle Circles/Pumps: AROM;Both;15 reps;Supine Quad Sets: AROM;Both;10 reps;Supine Heel Slides: AAROM;Right;15 reps;Supine Straight Leg Raises: AAROM;Right;10 reps;Supine Goniometric ROM: AAROM R knee -10 - 40    General Comments        Pertinent Vitals/Pain Pain Assessment: 0-10 Pain Score: 4  Pain Location: R knee Pain Descriptors / Indicators: Aching;Sore Pain Intervention(s): Limited activity within patient's tolerance;Monitored during session;Premedicated before session;Ice applied    Home Living Family/patient expects to be discharged to:: Private residence Living Arrangements: Spouse/significant other Available Help at Discharge: Family Type of Home: House Home Access: Stairs to enter Entrance Stairs-Rails: Right;Left Home Layout: One level Home Equipment: Environmental consultant - 2 wheels;Grab bars - tub/shower;Hand held shower head Additional Comments: BSC and rw already delivered to pt    Prior Function Level of Independence: Independent          PT Goals (current goals can now be found in the care plan section) Acute Rehab PT Goals Patient Stated Goal: Regain IND PT Goal Formulation: With patient Time For Goal Achievement: 04/14/16 Potential to Achieve Goals: Good Progress towards PT goals: Progressing toward goals    Frequency    7X/week      PT Plan Current plan remains appropriate    Co-evaluation  End of Session Equipment Utilized During Treatment: Gait belt;Right knee immobilizer Activity Tolerance: Patient tolerated treatment well Patient left: in chair;with call bell/phone within reach;with family/visitor present;with chair alarm set Nurse  Communication: Mobility status PT Visit Diagnosis: Difficulty in walking, not elsewhere classified (R26.2)     Time: 4237-0230 PT Time Calculation (min) (ACUTE ONLY): 28 min  Charges:  $Gait Training: 8-22 mins $Therapeutic Exercise: 8-22 mins                    G Codes:       Daniyla Pfahler May 11, 2016, 11:22 AM

## 2016-04-13 LAB — CBC
HCT: 37 % — ABNORMAL LOW (ref 39.0–52.0)
Hemoglobin: 12.6 g/dL — ABNORMAL LOW (ref 13.0–17.0)
MCH: 31.2 pg (ref 26.0–34.0)
MCHC: 34.1 g/dL (ref 30.0–36.0)
MCV: 91.6 fL (ref 78.0–100.0)
Platelets: 167 10*3/uL (ref 150–400)
RBC: 4.04 MIL/uL — ABNORMAL LOW (ref 4.22–5.81)
RDW: 13 % (ref 11.5–15.5)
WBC: 12.2 10*3/uL — ABNORMAL HIGH (ref 4.0–10.5)

## 2016-04-13 NOTE — Progress Notes (Signed)
Physical Therapy Treatment Patient Details Name: Ryan Rubio MRN: [id number] DOB: 1950-03-13 Today's Date: 22-Apr-2016    History of Present Illness Pt s/p R TKR    PT Comments    Pt continues very motivated.  Pt progressed therex program and reviewed home therex program.   Follow Up Recommendations  Outpatient PT     Equipment Recommendations  None recommended by PT    Recommendations for Other Services OT consult     Precautions / Restrictions Precautions Precautions: Knee;Fall Precaution Comments: reviewed Required Braces or Orthoses: Knee Immobilizer - Right Knee Immobilizer - Right: Discontinue once straight leg raise with < 10 degree lag (Pt performed IND SLR this am) Restrictions Weight Bearing Restrictions: No Other Position/Activity Restrictions: WBAT    Mobility  Bed Mobility Overal bed mobility: Needs Assistance Bed Mobility: Supine to Sit     Supine to sit: Min guard     General bed mobility comments: Increased time with pt self assisting R LE with UEs  Transfers                    Ambulation/Gait                 Stairs            Wheelchair Mobility    Modified Rankin (Stroke Patients Only)       Balance                                    Cognition Arousal/Alertness: Awake/alert Behavior During Therapy: WFL for tasks assessed/performed Overall Cognitive Status: Within Functional Limits for tasks assessed                      Exercises Total Joint Exercises Ankle Circles/Pumps: AROM;Both;15 reps;Supine Quad Sets: AROM;Both;Supine;15 reps Heel Slides: AAROM;Right;Supine;20 reps (Pt self assisting with sheet) Straight Leg Raises: Right;Supine;AROM;AAROM;20 reps Long Arc Quad: AAROM;AROM;Right;10 reps;Seated Knee Flexion: AROM;AAROM;Right;10 reps;Seated (Pt self assisting with L LE)    General Comments        Pertinent Vitals/Pain Pain Assessment: 0-10 Pain Score: 6  Pain  Location: R knee Pain Descriptors / Indicators: Aching;Sore Pain Intervention(s): Limited activity within patient's tolerance;Monitored during session;Premedicated before session;Ice applied    Home Living                      Prior Function            PT Goals (current goals can now be found in the care plan section) Acute Rehab PT Goals Patient Stated Goal: Regain IND PT Goal Formulation: With patient Time For Goal Achievement: 04/14/16 Potential to Achieve Goals: Good Progress towards PT goals: Progressing toward goals    Frequency    7X/week      PT Plan Current plan remains appropriate    Co-evaluation             End of Session   Activity Tolerance: Patient tolerated treatment well Patient left: with call bell/phone within reach;Other (comment);with family/visitor present Nurse Communication: Mobility status PT Visit Diagnosis: Difficulty in walking, not elsewhere classified (R26.2)     Time: 3710-6269 PT Time Calculation (min) (ACUTE ONLY): 30 min  Charges:  $Therapeutic Exercise: 23-37 mins                    G Codes:       Ryan Rubio 2016-04-22,  12:07 PM

## 2016-04-13 NOTE — Progress Notes (Signed)
Dc instructions and prescriptions provided and explained to patient/family in detail. Pt verbalized understanding. Escorted to lobby in wheelchair via nursing tech.

## 2016-04-13 NOTE — Care Management Note (Signed)
Case Management Note  Patient Details  Name: Ryan Rubio MRN: [id number] Date of Birth: [birth year]  Subjective/Objective:    Right TKR                Action/Plan: Discharge Planning: AVS reviewed:  Pt preoperatively arranged with [facility] for [facility]. Pt has RW at home.    Expected Discharge Date:  04/13/16               Expected Discharge Plan:  [place]  In-House Referral:  NA  Discharge planning Services  CM Consult  Post Acute Care Choice:  Home Health Choice offered to:  Patient  DME Arranged:  N/A DME Agency:  NA  HH Arranged:  PT [place] Agency:  [facility] (formerly [employer])  Status of Service:  Completed, signed off  If discussed at [employer], dates discussed:    Additional Comments:  Erenest Rasher, RN 04/13/2016, 6:20 PM

## 2016-04-13 NOTE — Progress Notes (Signed)
Subjective: 2 Days Post-Op Procedure(s) (LRB): RIGHT TOTAL KNEE ARTHROPLASTY (Right)  Patient reports pain as mild to moderate.  Working well with therapy.  Denies fever, chills, N/V.  Tolerating POs well.  Denies BM, however admits to flatulence.  Patient reports that he is ready to go home.   Objective:   VITALS:  Temp:  [98 F (36.7 C)-98.7 F (37.1 C)] 98.6 F (37 C) (02/25 0615) Pulse Rate:  [72-79] 72 (02/25 0615) Resp:  [16-19] 19 (02/25 0615) BP: (116-153)/(60-69) 153/68 (02/25 0615) SpO2:  [98 %-100 %] 100 % (02/25 0615)  General: WDWN patient in NAD. Psych:  Appropriate mood and affect. Neuro:  A&O x 3, Moving all extremities, sensation intact to light touch HEENT:  EOMs intact Chest:  Even non-labored respirations Skin:  Dressing C/D/I, no rashes or lesions Extremities: warm/dry, mild edema, no erythema or echymosis.  No lymphadenopathy. Pulses: Popliteus 2+ MSK:  ROM: lacks 5 degrees of TKE, MMT: patient is able to perform quad set, (-) Homan's    LABS  Recent Labs  04/12/16 0435 04/13/16 0351  HGB 12.2* 12.6*  WBC 12.4* 12.2*  PLT 150 167    Recent Labs  04/12/16 0435  NA 139  K 4.6  CL 108  CO2 25  BUN 16  CREATININE 1.02  GLUCOSE 141*   No results for input(s): LABPT, INR in the last 72 hours.   Assessment/Plan: 2 Days Post-Op Procedure(s) (LRB): RIGHT TOTAL KNEE ARTHROPLASTY (Right)  WBAT R LE ASA for DVT prophylaxis D/C home today Plan for outpatient post-op visit with Dr. Weyman Croon, PA-C, [facility] Office:  [phone]

## 2016-04-13 NOTE — Discharge Instructions (Signed)
Ryan Simmer, MD [place]  Please read the following information regarding your care after surgery.  Medications  You only need a prescription for the narcotic pain medicine (ex. oxycodone, Percocet, Norco).  All of the other medicines listed below are available over the counter. X acetominophen (Tylenol) 650 mg every 4-6 hours as you need for minor pain X oxycodone as prescribed for moderate to severe pain X aleve 220 mg - 2 tablets twice daily with food - as needed for pain   Narcotic pain medicine (ex. oxycodone, Percocet, Vicodin) will cause constipation.  To prevent this problem, take the following medicines while you are taking any pain medicine. X docusate sodium (Colace) 100 mg twice a day X senna (Senokot) 2 tablets twice a day  X To help prevent blood clots, take a baby aspirin (81 mg) twice a day for two weeks after surgery.  You should also get up every hour while you are awake to move around.    Weight Bearing X Bear weight when you are able on your operated leg or foot per PT.   Cast / Splint / Dressing X Keep your dressing clean and dry.  Dont put anything (coat hanger, pencil, etc) down inside of it.  If it gets damp, use a hair dryer on the cool setting to dry it.  If it gets soaked, call the office to schedule an appointment for a cast change.    After your dressing, cast or splint is removed; you may shower, but do not soak or scrub the wound.  Allow the water to run over it, and then gently pat it dry.  Swelling It is normal for you to have swelling where you had surgery.  To reduce swelling and pain, keep your toes above your nose for at least 3 days after surgery.  It may be necessary to keep your foot or leg elevated for several weeks.  If it hurts, it should be elevated.  Follow Up Call my office at [phone] when you are discharged from the hospital or surgery center to schedule an appointment to be seen two weeks after surgery.  Call my  office at [phone] if you develop a fever >101.5 F, nausea, vomiting, bleeding from the surgical site or severe pain.

## 2016-04-13 NOTE — Progress Notes (Signed)
Physical Therapy Treatment Patient Details Name: Ryan Rubio MRN: [id number] DOB: 09/22/1950 Today's Date: 04/13/2016    History of Present Illness Pt s/p R TKR    PT Comments    Pt progressing well with mobility.  Reviewed stairs with pt, son and DIL.   Follow Up Recommendations  Outpatient PT     Equipment Recommendations  None recommended by PT    Recommendations for Other Services OT consult     Precautions / Restrictions Precautions Precautions: Knee;Fall Precaution Comments: reviewed Required Braces or Orthoses: Knee Immobilizer - Right Knee Immobilizer - Right: Discontinue once straight leg raise with < 10 degree lag Restrictions Weight Bearing Restrictions: No Other Position/Activity Restrictions: WBAT    Mobility  Bed Mobility Overal bed mobility: Needs Assistance Bed Mobility: Supine to Sit     Supine to sit: Min guard     General bed mobility comments: Increased time with pt self assisting R LE with UEs  Transfers Overall transfer level: Needs assistance Equipment used: Rolling walker (2 wheeled) Transfers: Sit to/from Stand Sit to Stand: Supervision         General transfer comment: cues for LE management and use of UEs to self assist  Ambulation/Gait Ambulation/Gait assistance: Min guard;Supervision Ambulation Distance (Feet): 125 Feet (and 15' back from bathroom) Assistive device: Rolling walker (2 wheeled) Gait Pattern/deviations: Step-to pattern;Decreased step length - right;Decreased step length - left;Shuffle;Trunk flexed Gait velocity: decr Gait velocity interpretation: Below normal speed for age/gender General Gait Details: min cues for posture and position from RW   Stairs Stairs: Yes   Stair Management: One rail Left;Two rails;Step to pattern;Forwards;With crutches Number of Stairs: 6 General stair comments: 3 stairs with bil rails and 3 stairs with single rail and crutch.  Cues for sequence and foot/crutch  placement  Wheelchair Mobility    Modified Rankin (Stroke Patients Only)       Balance                                    Cognition Arousal/Alertness: Awake/alert Behavior During Therapy: WFL for tasks assessed/performed Overall Cognitive Status: Within Functional Limits for tasks assessed                      Exercises Total Joint Exercises Ankle Circles/Pumps: AROM;Both;15 reps;Supine Quad Sets: AROM;Both;Supine;15 reps Heel Slides: AAROM;Right;Supine;20 reps (Pt self assisting with sheet) Straight Leg Raises: Right;Supine;AROM;AAROM;20 reps Long Arc Quad: AAROM;AROM;Right;10 reps;Seated Knee Flexion: AROM;AAROM;Right;10 reps;Seated (Pt self assisting with L LE)    General Comments        Pertinent Vitals/Pain Pain Assessment: 0-10 Pain Score: 6  Pain Location: R knee Pain Descriptors / Indicators: Aching;Sore Pain Intervention(s): Limited activity within patient's tolerance;Monitored during session;Premedicated before session;Ice applied    Home Living                      Prior Function            PT Goals (current goals can now be found in the care plan section) Acute Rehab PT Goals Patient Stated Goal: Regain IND PT Goal Formulation: With patient Time For Goal Achievement: 04/14/16 Potential to Achieve Goals: Good Progress towards PT goals: Progressing toward goals    Frequency    7X/week      PT Plan Current plan remains appropriate    Co-evaluation  End of Session Equipment Utilized During Treatment: Gait belt Activity Tolerance: Patient tolerated treatment well Patient left: in chair;with call bell/phone within reach;with family/visitor present Nurse Communication: Mobility status PT Visit Diagnosis: Difficulty in walking, not elsewhere classified (R26.2)     Time: 4627-0350 PT Time Calculation (min) (ACUTE ONLY): 29 min  Charges:  $Gait Training: 23-37 mins $Therapeutic Exercise:  23-37 mins                    G Codes:       Ryan Rubio 19-Apr-2016, 12:13 PM

## 2016-04-15 DIAGNOSIS — M25561 Pain in right knee: Secondary | ICD-10-CM | POA: Diagnosis not present

## 2016-04-18 DIAGNOSIS — M25561 Pain in right knee: Secondary | ICD-10-CM | POA: Diagnosis not present

## 2016-04-22 DIAGNOSIS — M25561 Pain in right knee: Secondary | ICD-10-CM | POA: Diagnosis not present

## 2016-04-25 DIAGNOSIS — Z471 Aftercare following joint replacement surgery: Secondary | ICD-10-CM | POA: Diagnosis not present

## 2016-04-25 DIAGNOSIS — Z96651 Presence of right artificial knee joint: Secondary | ICD-10-CM | POA: Diagnosis not present

## 2016-04-25 DIAGNOSIS — M25561 Pain in right knee: Secondary | ICD-10-CM | POA: Diagnosis not present

## 2016-04-28 NOTE — Discharge Summary (Signed)
Physician Discharge Summary  Patient ID: Ryan Rubio MRN: [id number] DOB/AGE: 68-Aug-1952 68 y.o.  Admit date: 04/11/2016 Discharge date: 04/13/16  Admission [facility]  Discharge Diagnoses:  Active Problems:   S/P knee replacement   Discharged Condition: good  Hospital Course:  Ryan Rubio is a 66 y.o. who was admitted to [facility]. They were brought to the operating room on 04/11/2016 and underwent Procedure(s): RIGHT TOTAL KNEE ARTHROPLASTY.  Patient tolerated the procedure well and was later transferred to the recovery room and then to the orthopaedic floor for postoperative care.  They were given PO and IV analgesics for pain control following their surgery.  They were given 24 hours of postoperative antibiotics of  Anti-infectives    Start     Dose/Rate Route Frequency Ordered Stop   04/11/16 1600  ceFAZolin (ANCEF) IVPB 2g/100 mL premix     2 g 200 mL/hr over 30 Minutes Intravenous Every 6 hours 04/11/16 1331 04/11/16 2250   04/11/16 0811  ceFAZolin (ANCEF) IVPB 2g/100 mL premix     2 g 200 mL/hr over 30 Minutes Intravenous On call to O.R. 04/11/16 5400 04/11/16 1022     and started on DVT prophylaxis in the form of lovenox.   PT and OT were ordered for total joint protocol.  Discharge planning consulted to help with postop disposition and equipment needs.  Patient had a good night on the evening of surgery and started to get up OOB with therapy on day one.  Hemovac drain was pulled without difficulty.  Continued to work with therapy into day two.  Dressing was with normal limits.  The patient had progressed with therapy and meeting their goals. Patient was seen in rounds and was ready to go home.  Consults: n/a  Significant Diagnostic Studies: routine  Treatments: routine  Discharge Exam: Blood pressure (!) 153/68, pulse 72, temperature 98.6 F (37 C), temperature source Oral, resp. rate 19, height 5\' 10"  (1.778 m), weight 81.6 kg (180 lb), SpO2  100 %. Well nourished. Alert and oriented x3. RRR, Lungs clear, BS x4. Abdomen soft and non tender. Right Calf soft and non tender. Right knee dressing C/D/I. No DVT signs. Compartment soft. No signs of infection.  Right LE grossly neurovascular intact. Disposition: 01-Home or Self Care  Discharge Instructions    Call MD / Call 911    Complete by:  As directed    If you experience chest pain or shortness of breath, CALL 911 and be transported to the hospital emergency room.  If you develope a fever above 101 F, pus (white drainage) or increased drainage or redness at the wound, or calf pain, call your surgeon's office.   Call MD / Call 911    Complete by:  As directed    If you experience chest pain or shortness of breath, CALL 911 and be transported to the hospital emergency room.  If you develope a fever above 101 F, pus (white drainage) or increased drainage or redness at the wound, or calf pain, call your surgeon's office.   Constipation Prevention    Complete by:  As directed    Drink plenty of fluids.  Prune juice may be helpful.  You may use a stool softener, such as Colace (over the counter) 100 mg twice a day.  Use MiraLax (over the counter) for constipation as needed.   Constipation Prevention    Complete by:  As directed    Drink plenty of fluids.  Prune juice  may be helpful.  You may use a stool softener, such as Colace (over the counter) 100 mg twice a day.  Use MiraLax (over the counter) for constipation as needed.   Diet - low sodium heart healthy    Complete by:  As directed    Diet - low sodium heart healthy    Complete by:  As directed    Discharge instructions    Complete by:  As directed    INSTRUCTIONS AFTER JOINT REPLACEMENT   Remove items at home which could result in a fall. This includes throw rugs or furniture in walking pathways ICE to the affected joint every three hours while awake for 30 minutes at a time, for at least the first 3-5 days, and then as needed for  pain and swelling.  Continue to use ice for pain and swelling. You may notice swelling that will progress down to the foot and ankle.  This is normal after surgery.  Elevate your leg when you are not up walking on it.   Continue to use the breathing machine you got in the hospital (incentive spirometer) which will help keep your temperature down.  It is common for your temperature to cycle up and down following surgery, especially at night when you are not up moving around and exerting yourself.  The breathing machine keeps your lungs expanded and your temperature down.   DIET:  As you were doing prior to hospitalization, we recommend a well-balanced diet.  DRESSING / WOUND CARE / SHOWERING  Keep the surgical dressing until follow up.  The dressing is water proof, so you can shower without any extra covering.  IF THE DRESSING FALLS OFF or the wound gets wet inside, change the dressing with sterile gauze.  Please use good hand washing techniques before changing the dressing.  Do not use any lotions or creams on the incision until instructed by your surgeon.    ACTIVITY  Increase activity slowly as tolerated, but follow the weight bearing instructions below.   No driving for 6 weeks or until further direction given by your physician.  You cannot drive while taking narcotics.  No lifting or carrying greater than 10 lbs. until further directed by your surgeon. Avoid periods of inactivity such as sitting longer than an hour when not asleep. This helps prevent blood clots.  You may return to work once you are authorized by your doctor.     WEIGHT BEARING   Weight bearing as tolerated with assist device (walker, cane, etc) as directed, use it as long as suggested by your surgeon or therapist, typically at least 4-6 weeks.   EXERCISES  Results after joint replacement surgery are often greatly improved when you follow the exercise, range of motion and muscle strengthening exercises prescribed by  your doctor. Safety measures are also important to protect the joint from further injury. Any time any of these exercises cause you to have increased pain or swelling, decrease what you are doing until you are comfortable again and then slowly increase them. If you have problems or questions, call your caregiver or physical therapist for advice.   Rehabilitation is important following a joint replacement. After just a few days of immobilization, the muscles of the leg can become weakened and shrink (atrophy).  These exercises are designed to build up the tone and strength of the thigh and leg muscles and to improve motion. Often times heat used for twenty to thirty minutes before working out will loosen up your  tissues and help with improving the range of motion but do not use heat for the first two weeks following surgery (sometimes heat can increase post-operative swelling).   These exercises can be done on a training (exercise) mat, on the floor, on a table or on a bed. Use whatever works the best and is most comfortable for you.    Use music or television while you are exercising so that the exercises are a pleasant break in your day. This will make your life better with the exercises acting as a break in your routine that you can look forward to.   Perform all exercises about fifteen times, three times per day or as directed.  You should exercise both the operative leg and the other leg as well.   Exercises include:   Quad Sets - Tighten up the muscle on the front of the thigh (Quad) and hold for 5-10 seconds.   Straight Leg Raises - With your knee straight (if you were given a brace, keep it on), lift the leg to 60 degrees, hold for 3 seconds, and slowly lower the leg.  Perform this exercise against resistance later as your leg gets stronger.  Leg Slides: Lying on your back, slowly slide your foot toward your buttocks, bending your knee up off the floor (only go as far as is comfortable). Then slowly  slide your foot back down until your leg is flat on the floor again.  Angel Wings: Lying on your back spread your legs to the side as far apart as you can without causing discomfort.  Hamstring Strength:  Lying on your back, push your heel against the floor with your leg straight by tightening up the muscles of your buttocks.  Repeat, but this time bend your knee to a comfortable angle, and push your heel against the floor.  You may put a pillow under the heel to make it more comfortable if necessary.   A rehabilitation program following joint replacement surgery can speed recovery and prevent re-injury in the future due to weakened muscles. Contact your doctor or a physical therapist for more information on knee rehabilitation.    CONSTIPATION  Constipation is defined medically as fewer than three stools per week and severe constipation as less than one stool per week.  Even if you have a regular bowel pattern at home, your normal regimen is likely to be disrupted due to multiple reasons following surgery.  Combination of anesthesia, postoperative narcotics, change in appetite and fluid intake all can affect your bowels.   YOU MUST use at least one of the following options; they are listed in order of increasing strength to get the job done.  They are all available over the counter, and you may need to use some, POSSIBLY even all of these options:    Drink plenty of fluids (prune juice may be helpful) and high fiber foods Colace 100 mg by mouth twice a day  Senokot for constipation as directed and as needed Dulcolax (bisacodyl), take with full glass of water  Miralax (polyethylene glycol) once or twice a day as needed.  If you have tried all these things and are unable to have a bowel movement in the first 3-4 days after surgery call either your surgeon or your primary doctor.    If you experience loose stools or diarrhea, hold the medications until you stool forms back up.  If your symptoms do  not get better within 1 week or if they get worse,  check with your doctor.  If you experience "the worst abdominal pain ever" or develop nausea or vomiting, please contact the office immediately for further recommendations for treatment.   ITCHING:  If you experience itching with your medications, try taking only a single pain pill, or even half a pain pill at a time.  You can also use Benadryl over the counter for itching or also to help with sleep.   TED HOSE STOCKINGS:  Use stockings on both legs until for at least 2 weeks or as directed by physician office. They may be removed at night for sleeping.  MEDICATIONS:  See your medication summary on the "After Visit Summary" that nursing will review with you.  You may have some home medications which will be placed on hold until you complete the course of blood thinner medication.  It is important for you to complete the blood thinner medication as prescribed.  PRECAUTIONS:  If you experience chest pain or shortness of breath - call 911 immediately for transfer to the hospital emergency department.   If you develop a fever greater that 101 F, purulent drainage from wound, increased redness or drainage from wound, foul odor from the wound/dressing, or calf pain - CONTACT YOUR SURGEON.                                                   FOLLOW-UP APPOINTMENTS:  If you do not already have a post-op appointment, please call the office for an appointment to be seen by your surgeon.  Guidelines for how soon to be seen are listed in your "After Visit Summary", but are typically between 1-4 weeks after surgery.  OTHER INSTRUCTIONS:   Knee Replacement:  Do not place pillow under knee, focus on keeping the knee straight while resting. CPM instructions: 0-90 degrees, 2 hours in the morning, 2 hours in the afternoon, and 2 hours in the evening. Place foam block, curve side up under heel at all times except when in CPM or when walking.  DO NOT modify, tear, cut, or  change the foam block in any way.  MAKE SURE YOU:  Understand these instructions.  Get help right away if you are not doing well or get worse.    Thank you for letting us be a part of your medical care team.  It is a privilege we respect greatly.  We hope these instructions will help you stay on track for a fast and full recovery!   Increase activity slowly as tolerated    Complete by:  As directed    Increase activity slowly as tolerated    Complete by:  As directed    Weight bearing as tolerated    Complete by:  As directed    Laterality:  right   Extremity:  Lower     Allergies as of 04/13/2016   No Known Allergies     Medication List    TAKE these medications   aspirin EC 325 MG tablet Take 1 tablet (325 mg total) by mouth 2 (two) times daily.   methocarbamol 500 MG tablet Commonly known as:  ROBAXIN Take 1 tablet (500 mg total) by mouth every 8 (eight) hours as needed for muscle spasms.   oxyCODONE 5 MG immediate release tablet Commonly known as:  ROXICODONE Take 1-2 tablets (5-10 mg total) by mouth every  4 (four) hours as needed for severe pain.   pravastatin 40 MG tablet Commonly known as:  PRAVACHOL Take 40 mg by mouth daily.      Follow-up Information    Cynda Familia, MD. Schedule an appointment as soon as possible for a visit in 2 week(s).   Specialty:  Orthopedic Surgery Contact information: [address] [place] [address] [phone]           Signed: Lajean Manes 04/28/2016, 3:26 PM

## 2016-04-29 DIAGNOSIS — M25561 Pain in right knee: Secondary | ICD-10-CM | POA: Diagnosis not present

## 2016-05-02 DIAGNOSIS — M25561 Pain in right knee: Secondary | ICD-10-CM | POA: Diagnosis not present

## 2016-05-06 DIAGNOSIS — M25561 Pain in right knee: Secondary | ICD-10-CM | POA: Diagnosis not present

## 2016-05-09 DIAGNOSIS — M25561 Pain in right knee: Secondary | ICD-10-CM | POA: Diagnosis not present

## 2016-05-13 DIAGNOSIS — M25561 Pain in right knee: Secondary | ICD-10-CM | POA: Diagnosis not present

## 2016-05-15 DIAGNOSIS — M25561 Pain in right knee: Secondary | ICD-10-CM | POA: Diagnosis not present

## 2016-05-20 DIAGNOSIS — M25561 Pain in right knee: Secondary | ICD-10-CM | POA: Diagnosis not present

## 2016-05-23 DIAGNOSIS — Z96651 Presence of right artificial knee joint: Secondary | ICD-10-CM | POA: Diagnosis not present

## 2016-05-23 DIAGNOSIS — Z471 Aftercare following joint replacement surgery: Secondary | ICD-10-CM | POA: Diagnosis not present

## 2016-05-23 DIAGNOSIS — M25561 Pain in right knee: Secondary | ICD-10-CM | POA: Diagnosis not present

## 2016-05-27 DIAGNOSIS — M25561 Pain in right knee: Secondary | ICD-10-CM | POA: Diagnosis not present

## 2016-05-30 DIAGNOSIS — M25561 Pain in right knee: Secondary | ICD-10-CM | POA: Diagnosis not present

## 2016-06-03 DIAGNOSIS — M25561 Pain in right knee: Secondary | ICD-10-CM | POA: Diagnosis not present

## 2016-06-06 DIAGNOSIS — M25561 Pain in right knee: Secondary | ICD-10-CM | POA: Diagnosis not present

## 2016-06-10 DIAGNOSIS — M25561 Pain in right knee: Secondary | ICD-10-CM | POA: Diagnosis not present

## 2016-06-13 DIAGNOSIS — M25561 Pain in right knee: Secondary | ICD-10-CM | POA: Diagnosis not present

## 2016-07-01 DIAGNOSIS — Z471 Aftercare following joint replacement surgery: Secondary | ICD-10-CM | POA: Diagnosis not present

## 2016-07-01 DIAGNOSIS — Z96651 Presence of right artificial knee joint: Secondary | ICD-10-CM | POA: Diagnosis not present

## 2016-07-21 NOTE — Anesthesia Postprocedure Evaluation (Signed)
Anesthesia Post Note  Patient: Ryan Rubio  Procedure(s) Performed: Procedure(s) (LRB): RIGHT TOTAL KNEE ARTHROPLASTY (Right)     Anesthesia Post Evaluation  Last Vitals:  Vitals:   04/12/16 2131 04/13/16 0615  BP: (!) 144/69 (!) 153/68  Pulse: 79 72  Resp: 18 19  Temp: 37.1 C 37 C    Last Pain:  Vitals:   04/13/16 0820  TempSrc:   PainSc: 0-No pain                 Alainah Phang S

## 2016-07-21 NOTE — Addendum Note (Signed)
Addendum  created 07/21/16 1130 by Myrtie Soman, MD   Sign clinical note

## 2016-07-23 DIAGNOSIS — Z96651 Presence of right artificial knee joint: Secondary | ICD-10-CM | POA: Diagnosis not present

## 2016-07-23 DIAGNOSIS — Z471 Aftercare following joint replacement surgery: Secondary | ICD-10-CM | POA: Diagnosis not present

## 2016-08-07 DIAGNOSIS — M25661 Stiffness of right knee, not elsewhere classified: Secondary | ICD-10-CM | POA: Diagnosis not present

## 2016-08-28 DIAGNOSIS — M25661 Stiffness of right knee, not elsewhere classified: Secondary | ICD-10-CM | POA: Diagnosis not present

## 2016-12-10 DIAGNOSIS — Z87891 Personal history of nicotine dependence: Secondary | ICD-10-CM | POA: Diagnosis not present

## 2016-12-10 DIAGNOSIS — E7800 Pure hypercholesterolemia, unspecified: Secondary | ICD-10-CM | POA: Diagnosis not present

## 2016-12-10 DIAGNOSIS — Z5181 Encounter for therapeutic drug level monitoring: Secondary | ICD-10-CM | POA: Diagnosis not present

## 2016-12-23 ENCOUNTER — Other Ambulatory Visit: Payer: Self-pay | Admitting: Acute Care

## 2016-12-23 DIAGNOSIS — Z87891 Personal history of nicotine dependence: Secondary | ICD-10-CM

## 2016-12-23 DIAGNOSIS — Z122 Encounter for screening for malignant neoplasm of respiratory organs: Secondary | ICD-10-CM

## 2017-01-14 ENCOUNTER — Ambulatory Visit (INDEPENDENT_AMBULATORY_CARE_PROVIDER_SITE_OTHER)
Admission: RE | Admit: 2017-01-14 | Discharge: 2017-01-14 | Disposition: A | Discharge status: Home / Self Care | Payer: Medicare Other | Source: Ambulatory Visit | Attending: Acute Care | Admitting: Acute Care

## 2017-01-14 ENCOUNTER — Ambulatory Visit (INDEPENDENT_AMBULATORY_CARE_PROVIDER_SITE_OTHER): Payer: Medicare Other | Admitting: Acute Care

## 2017-01-14 ENCOUNTER — Encounter: Payer: Self-pay | Admitting: Acute Care

## 2017-01-14 DIAGNOSIS — Z87891 Personal history of nicotine dependence: Secondary | ICD-10-CM

## 2017-01-14 DIAGNOSIS — Z122 Encounter for screening for malignant neoplasm of respiratory organs: Secondary | ICD-10-CM

## 2017-01-14 NOTE — Progress Notes (Signed)
Shared Decision Making Visit Lung Cancer Screening Program [phone])   Eligibility:  Age 66 y.o.  Pack Years Smoking History Calculation 69 pack year smoking history (# packs/per year x # years smoked)  Recent History of coughing up blood  no  Unexplained weight loss? no ( >Than 15 pounds within the last 6 months )  Prior History Lung / other cancer no (Diagnosis within the last 5 years already requiring surveillance chest CT Scans).  Smoking Status Former Smoker  Former Smokers: Years since quit: 5 years  Quit Date: 2013  Visit Components:  Discussion included one or more decision making aids. yes  Discussion included risk/benefits of screening. yes  Discussion included potential follow up diagnostic testing for abnormal scans. yes  Discussion included meaning and risk of over diagnosis. yes  Discussion included meaning and risk of False Positives. yes  Discussion included meaning of total radiation exposure. yes  Counseling Included:  Importance of adherence to annual lung cancer LDCT screening. yes  Impact of comorbidities on ability to participate in the program. yes  Ability and willingness to under diagnostic treatment. yes  Smoking Cessation Counseling:  Current Smokers:   Discussed importance of smoking cessation. NA Former smoker  Information about tobacco cessation classes and interventions provided to patient. yes  Patient provided with "ticket" for LDCT Scan. yes  Symptomatic Patient. no  Counseling  Diagnosis Code: Tobacco Use Z72.0  Asymptomatic Patient yes  Counseling (Intermediate counseling: > three minutes counseling) H4742  Former Smokers:   Discussed the importance of maintaining cigarette abstinence. yes  Diagnosis Code: Personal History of Nicotine Dependence. V95.638  Information about tobacco cessation classes and interventions provided to patient. Yes  Patient provided with "ticket" for LDCT Scan. yes  Written Order for  Lung Cancer Screening with LDCT placed in Epic. Yes (CT Chest Lung Cancer Screening Low Dose W/O CM) [id number] Z12.2-Screening of respiratory organs Z87.891-Personal history of nicotine dependence  I spent 25 minutes of face to face time with Ryan Rubio discussing the risks and benefits of lung cancer screening. We viewed a power point together that explained in detail the above noted topics. We took the time to pause the power point at intervals to allow for questions to be asked and answered to ensure understanding. We discussed that he had taken the single most powerful action possible to decrease his risk of developing lung cancer when he quit smoking. I counseled him to remain smoke free, and to contact me if he ever had the desire to smoke again so that I can provide resources and tools to help support the effort to remain smoke free. We discussed the time and location of the scan, and that either  Doroteo Glassman RN or I will call with the results within  24-48 hours of receiving them. Ryan Rubio  has my card and contact information in the event he needs to speak with me, in addition to a copy of the power point we reviewed as a resource. He verbalized understanding of all of the above and had no further questions upon leaving the office.     I explained to the patient that there has been a high incidence of coronary artery disease noted on these exams. I explained that this is a non-gated exam therefore degree or severity cannot be determined. This patient is currently on statin therapy. I have asked the patient to follow-up with their PCP regarding any incidental finding of coronary artery disease and management with diet or medication as  they feel is clinically indicated. The patient verbalized understanding of the above and had no further questions.    Magdalen Spatz, NP 01/14/2017

## 2017-01-19 ENCOUNTER — Other Ambulatory Visit: Payer: Self-pay | Admitting: Acute Care

## 2017-01-19 DIAGNOSIS — Z122 Encounter for screening for malignant neoplasm of respiratory organs: Secondary | ICD-10-CM

## 2017-01-19 DIAGNOSIS — R7989 Other specified abnormal findings of blood chemistry: Secondary | ICD-10-CM | POA: Diagnosis not present

## 2017-01-19 DIAGNOSIS — Z87891 Personal history of nicotine dependence: Secondary | ICD-10-CM

## 2017-08-13 DIAGNOSIS — R945 Abnormal results of liver function studies: Secondary | ICD-10-CM | POA: Diagnosis not present

## 2017-12-16 DIAGNOSIS — E7800 Pure hypercholesterolemia, unspecified: Secondary | ICD-10-CM | POA: Diagnosis not present

## 2017-12-16 DIAGNOSIS — M170 Bilateral primary osteoarthritis of knee: Secondary | ICD-10-CM | POA: Diagnosis not present

## 2017-12-16 DIAGNOSIS — Z0000 Encounter for general adult medical examination without abnormal findings: Secondary | ICD-10-CM | POA: Diagnosis not present

## 2017-12-16 DIAGNOSIS — Z1159 Encounter for screening for other viral diseases: Secondary | ICD-10-CM | POA: Diagnosis not present

## 2017-12-16 DIAGNOSIS — Z79899 Other long term (current) drug therapy: Secondary | ICD-10-CM | POA: Diagnosis not present

## 2017-12-16 DIAGNOSIS — Z86010 Personal history of colonic polyps: Secondary | ICD-10-CM | POA: Diagnosis not present

## 2017-12-16 DIAGNOSIS — Z125 Encounter for screening for malignant neoplasm of prostate: Secondary | ICD-10-CM | POA: Diagnosis not present

## 2018-01-20 ENCOUNTER — Inpatient Hospital Stay: Admission: RE | Admit: 2018-01-20 | Payer: Medicare Other | Source: Ambulatory Visit

## 2018-01-25 ENCOUNTER — Ambulatory Visit (INDEPENDENT_AMBULATORY_CARE_PROVIDER_SITE_OTHER)
Admission: RE | Admit: 2018-01-25 | Discharge: 2018-01-25 | Disposition: A | Discharge status: Home / Self Care | Payer: Medicare Other | Source: Ambulatory Visit | Attending: Acute Care | Admitting: Acute Care

## 2018-01-25 DIAGNOSIS — Z87891 Personal history of nicotine dependence: Secondary | ICD-10-CM

## 2018-01-25 DIAGNOSIS — Z122 Encounter for screening for malignant neoplasm of respiratory organs: Secondary | ICD-10-CM

## 2018-01-28 ENCOUNTER — Telehealth: Payer: Self-pay | Admitting: Acute Care

## 2018-01-28 DIAGNOSIS — Z122 Encounter for screening for malignant neoplasm of respiratory organs: Secondary | ICD-10-CM

## 2018-01-28 DIAGNOSIS — Z87891 Personal history of nicotine dependence: Secondary | ICD-10-CM

## 2018-01-28 NOTE — Telephone Encounter (Signed)
Pt informed of CT results per Sarah Groce, NP.  PT verbalized understanding.  Copy sent to PCP.  Order placed for 1 yr f/u CT.  

## 2018-02-04 DIAGNOSIS — H25043 Posterior subcapsular polar age-related cataract, bilateral: Secondary | ICD-10-CM | POA: Diagnosis not present

## 2018-12-22 DIAGNOSIS — H903 Sensorineural hearing loss, bilateral: Secondary | ICD-10-CM | POA: Diagnosis not present

## 2019-02-01 DIAGNOSIS — G4733 Obstructive sleep apnea (adult) (pediatric): Secondary | ICD-10-CM | POA: Diagnosis not present

## 2019-03-01 ENCOUNTER — Ambulatory Visit
Admission: RE | Admit: 2019-03-01 | Discharge: 2019-03-01 | Disposition: A | Discharge status: Home / Self Care | Payer: Medicare Other | Source: Ambulatory Visit | Attending: Acute Care | Admitting: Acute Care

## 2019-03-01 DIAGNOSIS — Z87891 Personal history of nicotine dependence: Secondary | ICD-10-CM

## 2019-03-01 DIAGNOSIS — Z122 Encounter for screening for malignant neoplasm of respiratory organs: Secondary | ICD-10-CM

## 2019-03-09 NOTE — Progress Notes (Signed)
Please call patient and let them  know their  low dose Ct was read as a Lung RADS 2: nodules that are benign in appearance and behavior with a very low likelihood of becoming a clinically active cancer due to size or lack of growth. Recommendation per radiology is for a repeat LDCT in 12 months. .Please let them  know we will order and schedule their  annual screening scan for 02/2020. Please let them  know there was notation of CAD on their  scan.  Please remind the patient  that this is a non-gated exam therefore degree or severity of disease  cannot be determined. Please have them  follow up with their PCP regarding potential risk factor modification, dietary therapy or pharmacologic therapy if clinically indicated. Pt.  is  currently on statin therapy. Please place order for annual  screening scan for  02/2020 and fax results to PCP. Thanks so much. 

## 2019-03-09 NOTE — Progress Notes (Signed)
Please also let patient know there was notation of Hepatic steatosis. This  is a term that describes the build up of fat in the liver. It is normal to have small amounts of fat in your liver, but when the proportion of liver cells that contain fat exceeds more than 5% it is indicative of early stage fatty liver.Treatment often involves reducing risk factors through a diet and exercise plan. It is generally a benign condition, but in a small percentage of patients it does require follow up. Please have the patient follow up with PCP regarding potential risk factor modification, dietary therapy or pharmacologic therapy if clinically indicated. Thanks so much

## 2019-03-10 ENCOUNTER — Other Ambulatory Visit: Payer: Self-pay | Admitting: *Deleted

## 2019-03-10 DIAGNOSIS — Z87891 Personal history of nicotine dependence: Secondary | ICD-10-CM

## 2019-03-27 ENCOUNTER — Ambulatory Visit: Payer: Medicare Other | Attending: Internal Medicine

## 2019-03-27 DIAGNOSIS — Z23 Encounter for immunization: Secondary | ICD-10-CM | POA: Insufficient documentation

## 2019-03-27 NOTE — Progress Notes (Signed)
   Covid-19 Vaccination Clinic  Name:  Ryan Rubio    MRN: [id number] DOB: [birth year]  03/27/2019  [name] was observed post Covid-19 immunization for 15 minutes without incidence. He was provided with Vaccine Information Sheet and instruction to access the V-Safe system.   [name] was instructed to call 911 with any severe reactions post vaccine: Marland Kitchen Difficulty breathing  . Swelling of your face and throat  . A fast heartbeat  . A bad rash all over your body  . Dizziness and weakness    Immunizations Administered    Name Date Dose VIS Date Route   Pfizer COVID-19 Vaccine 03/27/2019  3:58 AM 0.3 mL 01/28/2019 Intramuscular   Manufacturer: [place]   Lot: [id number]   [place]: [id number]

## 2019-04-12 ENCOUNTER — Ambulatory Visit: Payer: Medicare Other

## 2019-04-19 ENCOUNTER — Ambulatory Visit: Payer: Medicare Other | Attending: Internal Medicine

## 2019-04-19 DIAGNOSIS — Z23 Encounter for immunization: Secondary | ICD-10-CM

## 2019-04-19 NOTE — Progress Notes (Signed)
   Covid-19 Vaccination Clinic  Name:  AUDIEL SCHEIBER    MRN: [id number] DOB: Dec 13, 1950  04/19/2019  Mr. Dunlevy was observed post Covid-19 immunization for 15 minutes without incident. He was provided with Vaccine Information Sheet and instruction to access the V-Safe system.   Mr. Hitsman was instructed to call 911 with any severe reactions post vaccine: Marland Kitchen Difficulty breathing  . Swelling of face and throat  . A fast heartbeat  . A bad rash all over body  . Dizziness and weakness   Immunizations Administered    Name Date Dose VIS Date Route   Pfizer COVID-19 Vaccine 04/19/2019 10:38 AM 0.3 mL 01/28/2019 Intramuscular   Manufacturer: [place]   Lot: [id number]   [place]: [id number]

## 2019-08-26 DIAGNOSIS — G4733 Obstructive sleep apnea (adult) (pediatric): Secondary | ICD-10-CM | POA: Diagnosis not present

## 2019-08-26 DIAGNOSIS — Z0000 Encounter for general adult medical examination without abnormal findings: Secondary | ICD-10-CM | POA: Diagnosis not present

## 2019-08-26 DIAGNOSIS — Z125 Encounter for screening for malignant neoplasm of prostate: Secondary | ICD-10-CM | POA: Diagnosis not present

## 2019-08-26 DIAGNOSIS — R197 Diarrhea, unspecified: Secondary | ICD-10-CM | POA: Diagnosis not present

## 2019-08-26 DIAGNOSIS — E7800 Pure hypercholesterolemia, unspecified: Secondary | ICD-10-CM | POA: Diagnosis not present

## 2019-08-26 DIAGNOSIS — N5201 Erectile dysfunction due to arterial insufficiency: Secondary | ICD-10-CM | POA: Diagnosis not present

## 2019-10-27 DIAGNOSIS — R198 Other specified symptoms and signs involving the digestive system and abdomen: Secondary | ICD-10-CM | POA: Diagnosis not present

## 2019-11-08 DIAGNOSIS — H43313 Vitreous membranes and strands, bilateral: Secondary | ICD-10-CM | POA: Diagnosis not present

## 2019-12-19 DIAGNOSIS — R7301 Impaired fasting glucose: Secondary | ICD-10-CM | POA: Diagnosis not present

## 2019-12-19 DIAGNOSIS — R600 Localized edema: Secondary | ICD-10-CM | POA: Diagnosis not present

## 2019-12-19 DIAGNOSIS — I10 Essential (primary) hypertension: Secondary | ICD-10-CM | POA: Diagnosis not present

## 2019-12-19 DIAGNOSIS — M7989 Other specified soft tissue disorders: Secondary | ICD-10-CM | POA: Diagnosis not present

## 2019-12-28 DIAGNOSIS — N049 Nephrotic syndrome with unspecified morphologic changes: Secondary | ICD-10-CM | POA: Diagnosis not present

## 2019-12-28 DIAGNOSIS — M7989 Other specified soft tissue disorders: Secondary | ICD-10-CM | POA: Diagnosis not present

## 2019-12-30 DIAGNOSIS — M7989 Other specified soft tissue disorders: Secondary | ICD-10-CM | POA: Diagnosis not present

## 2019-12-30 DIAGNOSIS — R198 Other specified symptoms and signs involving the digestive system and abdomen: Secondary | ICD-10-CM | POA: Diagnosis not present

## 2020-01-02 DIAGNOSIS — N1831 Chronic kidney disease, stage 3a: Secondary | ICD-10-CM | POA: Diagnosis not present

## 2020-01-02 DIAGNOSIS — I129 Hypertensive chronic kidney disease with stage 1 through stage 4 chronic kidney disease, or unspecified chronic kidney disease: Secondary | ICD-10-CM | POA: Diagnosis not present

## 2020-01-02 DIAGNOSIS — R609 Edema, unspecified: Secondary | ICD-10-CM | POA: Diagnosis not present

## 2020-01-02 DIAGNOSIS — N049 Nephrotic syndrome with unspecified morphologic changes: Secondary | ICD-10-CM | POA: Diagnosis not present

## 2020-01-02 DIAGNOSIS — N183 Chronic kidney disease, stage 3 unspecified: Secondary | ICD-10-CM | POA: Diagnosis not present

## 2020-01-03 ENCOUNTER — Other Ambulatory Visit: Payer: Self-pay | Admitting: Nephrology

## 2020-01-03 DIAGNOSIS — N1831 Chronic kidney disease, stage 3a: Secondary | ICD-10-CM

## 2020-01-04 DIAGNOSIS — N049 Nephrotic syndrome with unspecified morphologic changes: Secondary | ICD-10-CM | POA: Diagnosis not present

## 2020-01-10 ENCOUNTER — Other Ambulatory Visit: Payer: Self-pay | Admitting: Nephrology

## 2020-01-10 ENCOUNTER — Other Ambulatory Visit (HOSPITAL_COMMUNITY): Payer: Self-pay | Admitting: Nephrology

## 2020-01-10 DIAGNOSIS — R809 Proteinuria, unspecified: Secondary | ICD-10-CM

## 2020-01-17 ENCOUNTER — Ambulatory Visit
Admission: RE | Admit: 2020-01-17 | Discharge: 2020-01-17 | Disposition: A | Discharge status: Home / Self Care | Payer: Medicare Other | Source: Ambulatory Visit | Attending: Nephrology | Admitting: Nephrology

## 2020-01-17 DIAGNOSIS — N189 Chronic kidney disease, unspecified: Secondary | ICD-10-CM | POA: Diagnosis not present

## 2020-01-17 DIAGNOSIS — N1831 Chronic kidney disease, stage 3a: Secondary | ICD-10-CM

## 2020-01-20 ENCOUNTER — Other Ambulatory Visit (HOSPITAL_COMMUNITY): Payer: Self-pay | Admitting: Physician Assistant

## 2020-01-23 ENCOUNTER — Encounter (HOSPITAL_COMMUNITY): Payer: Self-pay

## 2020-01-23 ENCOUNTER — Ambulatory Visit (HOSPITAL_COMMUNITY)
Admission: RE | Admit: 2020-01-23 | Discharge: 2020-01-23 | Disposition: A | Discharge status: Home / Self Care | Payer: Medicare Other | Source: Ambulatory Visit | Attending: Nephrology | Admitting: Nephrology

## 2020-01-23 ENCOUNTER — Other Ambulatory Visit: Payer: Self-pay

## 2020-01-23 DIAGNOSIS — N052 Unspecified nephritic syndrome with diffuse membranous glomerulonephritis: Secondary | ICD-10-CM | POA: Insufficient documentation

## 2020-01-23 DIAGNOSIS — R809 Proteinuria, unspecified: Secondary | ICD-10-CM | POA: Diagnosis not present

## 2020-01-23 LAB — PROTIME-INR
INR: 1 (ref 0.8–1.2)
Prothrombin Time: 13.2 seconds (ref 11.4–15.2)

## 2020-01-23 LAB — CBC
HCT: 41.5 % (ref 39.0–52.0)
Hemoglobin: 13.8 g/dL (ref 13.0–17.0)
MCH: 30.5 pg (ref 26.0–34.0)
MCHC: 33.3 g/dL (ref 30.0–36.0)
MCV: 91.8 fL (ref 80.0–100.0)
Platelets: 172 10*3/uL (ref 150–400)
RBC: 4.52 MIL/uL (ref 4.22–5.81)
RDW: 12.4 % (ref 11.5–15.5)
WBC: 6.4 10*3/uL (ref 4.0–10.5)
nRBC: 0 % (ref 0.0–0.2)

## 2020-01-23 MED ORDER — FENTANYL CITRATE (PF) 100 MCG/2ML IJ SOLN
INTRAMUSCULAR | Status: AC
  Filled 2020-01-23: qty 2

## 2020-01-23 MED ORDER — SODIUM CHLORIDE 0.9 % IV SOLN
INTRAVENOUS | Status: AC | PRN
  Administered 2020-01-23: 10 mL/h via INTRAVENOUS

## 2020-01-23 MED ORDER — FENTANYL CITRATE (PF) 100 MCG/2ML IJ SOLN
INTRAMUSCULAR | Status: AC | PRN
Start: 2020-01-23 — End: 2020-01-23
  Administered 2020-01-23: 50 ug via INTRAVENOUS

## 2020-01-23 MED ORDER — GELATIN ABSORBABLE 12-7 MM EX MISC
CUTANEOUS | Status: AC
  Filled 2020-01-23: qty 1

## 2020-01-23 MED ORDER — LIDOCAINE HCL (PF) 1 % IJ SOLN
INTRAMUSCULAR | Status: AC
  Filled 2020-01-23: qty 30

## 2020-01-23 MED ORDER — SODIUM CHLORIDE 0.9 % IV SOLN: INTRAVENOUS | Status: DC

## 2020-01-23 MED ORDER — MIDAZOLAM HCL 2 MG/2ML IJ SOLN
INTRAMUSCULAR | Status: AC | PRN
  Administered 2020-01-23: 1 mg via INTRAVENOUS

## 2020-01-23 MED ORDER — MIDAZOLAM HCL 2 MG/2ML IJ SOLN
INTRAMUSCULAR | Status: AC
  Filled 2020-01-23: qty 2

## 2020-01-23 NOTE — Discharge Instructions (Addendum)
Percutaneous Kidney Biopsy, Care After This sheet gives you information about how to care for yourself after your procedure. Your health care provider may also give you more specific instructions. If you have problems or questions, contact your health care provider. What can I expect after the procedure? After the procedure, it is common to have:  Pain or soreness near the biopsy site.  Pink or cloudy urine for 24 hours after the procedure. Follow these instructions at home: Activity  Return to your normal activities as told by your health care provider. Ask your health care provider what activities are safe for you.  If you were given a sedative during the procedure, it can affect you for several hours. Do not drive or operate machinery until your health care provider says that it is safe.  Do not lift anything that is heavier than 10 lb (4.5 kg), or the limit that you are told, until your health care provider says that it is safe.  Avoid activities that take a lot of effort (are strenuous) until your health care provider approves. Most people will have to wait 2 weeks before returning to activities such as exercise or sex. General instructions   Take over-the-counter and prescription medicines only as told by your health care provider.  You may eat and drink after your procedure. Follow instructions from your health care provider about eating or drinking restrictions.  Check your biopsy site every day for signs of infection. Check for: ? More redness, swelling, or pain. ? Fluid or blood. ? Warmth. ? Pus or a bad smell.  Keep all follow-up visits as told by your health care provider. This is important. Contact a health care provider if:  You have more redness, swelling, or pain around your biopsy site.  You have fluid or blood coming from your biopsy site.  Your biopsy site feels warm to the touch.  You have pus or a bad smell coming from your biopsy site.  You have blood  in your urine more than 24 hours after your procedure.  You have a fever. Get help right away if:  Your urine is dark red or brown.  You cannot urinate.  It burns when you urinate.  You feel dizzy or light-headed.  You have severe pain in your abdomen or side. Summary  After the procedure, it is common to have pain or soreness at the biopsy site and pink or cloudy urine for the first 24 hours.  Check your biopsy site each day for signs of infection, such as more redness, swelling, or pain; fluid, blood, pus or a bad smell coming from the biopsy site; or the biopsy site feeling warm to touch.  Return to your normal activities as told by your health care provider. This information is not intended to replace advice given to you by your health care provider. Make sure you discuss any questions you have with your health care provider. Document Revised: 10/07/2018 Document Reviewed: 10/07/2018 Elsevier Patient Education  2020 Elsevier Inc. Moderate Conscious Sedation, Adult Sedation is the use of medicines to promote relaxation and relieve discomfort and anxiety. Moderate conscious sedation is a type of sedation. Under moderate conscious sedation, you are less alert than normal, but you are still able to respond to instructions, touch, or both. Moderate conscious sedation is used during short medical and dental procedures. It is milder than deep sedation, which is a type of sedation under which you cannot be easily woken up. It is also milder than   general anesthesia, which is the use of medicines to make you unconscious. Moderate conscious sedation allows you to return to your regular activities sooner. Tell a health care provider about:  Any allergies you have.  All medicines you are taking, including vitamins, herbs, eye drops, creams, and over-the-counter medicines.  Use of steroids (by mouth or creams).  Any problems you or family members have had with sedatives and anesthetic  medicines.  Any blood disorders you have.  Any surgeries you have had.  Any medical conditions you have, such as sleep apnea.  Whether you are pregnant or may be pregnant.  Any use of cigarettes, alcohol, marijuana, or street drugs. What are the risks? Generally, this is a safe procedure. However, problems may occur, including:  Getting too much medicine (oversedation).  Nausea.  Allergic reaction to medicines.  Trouble breathing. If this happens, a breathing tube may be used to help with breathing. It will be removed when you are awake and breathing on your own.  Heart trouble.  Lung trouble. What happens before the procedure? Staying hydrated Follow instructions from your health care provider about hydration, which may include:  Up to 2 hours before the procedure - you may continue to drink clear liquids, such as water, clear fruit juice, black coffee, and plain tea. Eating and drinking restrictions Follow instructions from your health care provider about eating and drinking, which may include:  8 hours before the procedure - stop eating heavy meals or foods such as meat, fried foods, or fatty foods.  6 hours before the procedure - stop eating light meals or foods, such as toast or cereal.  6 hours before the procedure - stop drinking milk or drinks that contain milk.  2 hours before the procedure - stop drinking clear liquids. Medicine Ask your health care provider about:  Changing or stopping your regular medicines. This is especially important if you are taking diabetes medicines or blood thinners.  Taking medicines such as aspirin and ibuprofen. These medicines can thin your blood. Do not take these medicines before your procedure if your health care provider instructs you not to.  Tests and exams  You will have a physical exam.  You may have blood tests done to show: ? How well your kidneys and liver are working. ? How well your blood can clot. General  instructions  Plan to have someone take you home from the hospital or clinic.  If you will be going home right after the procedure, plan to have someone with you for 24 hours. What happens during the procedure?  An IV tube will be inserted into one of your veins.  Medicine to help you relax (sedative) will be given through the IV tube.  The medical or dental procedure will be performed. What happens after the procedure?  Your blood pressure, heart rate, breathing rate, and blood oxygen level will be monitored often until the medicines you were given have worn off.  Do not drive for 24 hours. This information is not intended to replace advice given to you by your health care provider. Make sure you discuss any questions you have with your health care provider. Document Revised: 01/16/2017 Document Reviewed: 05/26/2015 Elsevier Patient Education  2020 Elsevier Inc.  

## 2020-01-23 NOTE — H&P (Signed)
Chief Complaint: Patient was seen in consultation today for a random renal biopsy   Referring Physician(s): Rosita Fire  Supervising Physician: Daryll Brod  Patient Status: [facility] - Out-pt  History of Present Illness: Ryan Rubio is a 69 y.o. male with a past medical history significant for arthritis and sleep apnea. He developed sudden onset lower extremity edema, hypertension and proteinuria. He was started on anti-hypertensives which helped with the lower extremity edema however he continued to have high blood pressure readings and proteinuria. He was referred to Nephrology. A renal ultrasound done 01/18/20 was negative for any renal abnormalities and other lab work up (ANA/ANCA, Hep B, HIV,etc) has been been negative. There is concern for underlying glomerular, intrinsic kidney disease.   Interventional Radiology has been asked to evaluate this patient for an image-guided random renal biopsy for further work up and evaluation.  Past Medical History:  Diagnosis Date  . Arthritis   . Sleep apnea    cpap- settings at20     Past Surgical History:  Procedure Laterality Date  . COLONOSCOPY     x 2  . TOTAL KNEE ARTHROPLASTY Right 04/11/2016   Procedure: RIGHT TOTAL KNEE ARTHROPLASTY;  Surgeon: Sydnee Cabal, MD;  Location: [facility] ORS;  Service: Orthopedics;  Laterality: Right;  Adductor Block    Allergies: Patient has no known allergies.  Medications: Prior to Admission medications   Medication Sig Start Date End Date Taking? Authorizing Provider  cholecalciferol (VITAMIN D3) 25 MCG (1000 UNIT) tablet Take 1,000 Units by mouth daily.   Yes [provider]  hydrochlorothiazide (HYDRODIURIL) 25 MG tablet Take 25 mg by mouth daily.   Yes [provider]  losartan (COZAAR) 50 MG tablet Take 50 mg by mouth in the morning and at bedtime.   Yes [provider]  pravastatin (PRAVACHOL) 40 MG tablet Take 40 mg by mouth daily. 03/04/16  Yes  [provider]     History reviewed. No pertinent family history.  Social History   Socioeconomic History  . Marital status: Married    Spouse name: Not on file  . Number of children: Not on file  . Years of education: Not on file  . Highest education level: Not on file  Occupational History  . Not on file  Tobacco Use  . Smoking status: Former Smoker    Packs/day: 1.50    Years: 46.00    Pack years: 69.00    Quit date: 2013    Years since quitting: 8.9  . Smokeless tobacco: Never Used  Vaping Use  . Vaping Use: Never used  Substance and Sexual Activity  . Alcohol use: Yes    Comment: 14 beers per week   . Drug use: No  . Sexual activity: Not on file  Other Topics Concern  . Not on file  Social History Narrative  . Not on file   Social Determinants of Health   Financial Resource Strain:   . Difficulty of Paying Living Expenses: Not on file  Food Insecurity:   . Worried About Charity fundraiser in the Last Year: Not on file  . Ran Out of Food in the Last Year: Not on file  Transportation Needs:   . Lack of Transportation (Medical): Not on file  . Lack of Transportation (Non-Medical): Not on file  Physical Activity:   . Days of Exercise per Week: Not on file  . Minutes of Exercise per Session: Not on file  Stress:   . Feeling  of Stress : Not on file  Social Connections:   . Frequency of Communication with Friends and Family: Not on file  . Frequency of Social Gatherings with Friends and Family: Not on file  . Attends Religious Services: Not on file  . Active Member of Clubs or Organizations: Not on file  . Attends Archivist Meetings: Not on file  . Marital Status: Not on file    Review of Systems: A 12 point ROS discussed and pertinent positives are indicated in the HPI above.  All other systems are negative.  Review of Systems  Constitutional: Negative for appetite change, fatigue and fever.  Respiratory: Negative for cough and  shortness of breath.   Cardiovascular: Negative for chest pain and leg swelling.  Gastrointestinal: Negative for abdominal pain, diarrhea, nausea and vomiting.  Genitourinary: Negative for flank pain.  Musculoskeletal: Negative for back pain.  Neurological: Negative for light-headedness and headaches.    Vital Signs: BP 137/72   Pulse 66   Temp 97.8 F (36.6 C) (Oral)   Resp 16   Ht 5' 9.5" (1.765 m)   Wt 180 lb (81.6 kg)   SpO2 99%   BMI 26.20 kg/m   Physical Exam Constitutional:      General: He is not in acute distress. HENT:     Mouth/Throat:     Mouth: Mucous membranes are moist.     Pharynx: Oropharynx is clear.  Cardiovascular:     Rate and Rhythm: Normal rate and regular rhythm.     Pulses: Normal pulses.     Heart sounds: Normal heart sounds.  Pulmonary:     Effort: Pulmonary effort is normal.     Breath sounds: Normal breath sounds.  Abdominal:     General: Bowel sounds are normal.     Palpations: Abdomen is soft.  Musculoskeletal:        General: Normal range of motion.     Cervical back: Normal range of motion.  Skin:    General: Skin is warm and dry.  Neurological:     Mental Status: He is alert and oriented to person, place, and time.     Imaging: US RENAL  Result Date: 01/18/2020 CLINICAL DATA:  Chronic kidney disease. EXAM: RENAL / URINARY TRACT ULTRASOUND COMPLETE COMPARISON:  None. FINDINGS: Right Kidney: Renal measurements: 10.6 x 5.2 x 6.6 cm = volume: 175 mL. Echogenicity within normal limits. No mass or hydronephrosis visualized. Left Kidney: Renal measurements: 12.1 x 5.7 x 4.7 cm = volume: 169 mL. Echogenicity within normal limits. No mass or hydronephrosis visualized. Bladder: Appears normal for degree of bladder distention. Other: None. IMPRESSION: No significant renal abnormality is noted. Electronically Signed   By: Marijo Conception M.D.   On: 01/18/2020 08:53    Labs:  CBC: Recent Labs    01/23/20 0625  WBC 6.4  HGB 13.8  HCT  41.5  PLT 172    COAGS: Recent Labs    01/23/20 0625  INR 1.0    BMP: No results for input(s): NA, K, CL, CO2, GLUCOSE, BUN, CALCIUM, CREATININE, GFRNONAA, GFRAA in the last 8760 hours.  Invalid input(s): CMP  LIVER FUNCTION TESTS: No results for input(s): BILITOT, AST, ALT, ALKPHOS, PROT, ALBUMIN in the last 8760 hours.  TUMOR MARKERS: No results for input(s): AFPTM, CEA, CA199, CHROMGRNA in the last 8760 hours.  Assessment and Plan:  Nephrotic Range Proteinuria: Ryan Rubio, 69 year old male, presents today to the [facility] Interventional Radiology department for an image-guided  random renal biopsy.  Risks and benefits of a random renal biopsy were discussed with the patient and/or patient's family including, but not limited to bleeding, infection, damage to adjacent structures or low yield requiring additional tests.  All of the questions were answered and there is agreement to proceed.  The patient has been NPO. Labs and vitals have been reviewed. He does not take any anticoagulation medicines.  Consent signed and in chart.  Thank you for this interesting consult.  I greatly enjoyed meeting Ryan Rubio and look forward to participating in their care.  A copy of this report was sent to the requesting provider on this date.  Electronically Signed: Soyla Dryer, AGACNP-BC [phone] 01/23/2020, 7:29 AM   I spent a total of  30 Minutes   in face to face in clinical consultation, greater than 50% of which was counseling/coordinating care for a random renal biopsy.

## 2020-01-23 NOTE — Procedures (Signed)
Interventional Radiology Procedure Note  Procedure: US renal bx     Complications: None  Estimated Blood Loss:  min  Findings: 16G core x 2    M. Daryll Brod, MD

## 2020-01-23 NOTE — Progress Notes (Signed)
Patient ambulated to restroom without difficulty. No blood noted in urine. Flank area- clean, dry, intact. Patient getting dressed and will be discharged once ride is out front.

## 2020-01-23 NOTE — Progress Notes (Signed)
Patient and family was given discharge instructions. Both verbalized understanding.

## 2020-02-02 DIAGNOSIS — N1831 Chronic kidney disease, stage 3a: Secondary | ICD-10-CM | POA: Diagnosis not present

## 2020-02-02 DIAGNOSIS — N049 Nephrotic syndrome with unspecified morphologic changes: Secondary | ICD-10-CM | POA: Diagnosis not present

## 2020-02-02 DIAGNOSIS — N042 Nephrotic syndrome with diffuse membranous glomerulonephritis: Secondary | ICD-10-CM | POA: Diagnosis not present

## 2020-02-02 DIAGNOSIS — R609 Edema, unspecified: Secondary | ICD-10-CM | POA: Diagnosis not present

## 2020-02-02 DIAGNOSIS — I129 Hypertensive chronic kidney disease with stage 1 through stage 4 chronic kidney disease, or unspecified chronic kidney disease: Secondary | ICD-10-CM | POA: Diagnosis not present

## 2020-02-02 DIAGNOSIS — E785 Hyperlipidemia, unspecified: Secondary | ICD-10-CM | POA: Diagnosis not present

## 2020-02-03 ENCOUNTER — Encounter (HOSPITAL_COMMUNITY): Payer: Self-pay

## 2020-02-03 LAB — SURGICAL PATHOLOGY

## 2020-02-07 DIAGNOSIS — Z23 Encounter for immunization: Secondary | ICD-10-CM | POA: Diagnosis not present

## 2020-02-15 DIAGNOSIS — G4733 Obstructive sleep apnea (adult) (pediatric): Secondary | ICD-10-CM | POA: Diagnosis not present

## 2020-02-23 ENCOUNTER — Other Ambulatory Visit: Payer: Self-pay | Admitting: *Deleted

## 2020-02-23 DIAGNOSIS — Z87891 Personal history of nicotine dependence: Secondary | ICD-10-CM

## 2020-03-07 DIAGNOSIS — N042 Nephrotic syndrome with diffuse membranous glomerulonephritis: Secondary | ICD-10-CM | POA: Diagnosis not present

## 2020-03-12 DIAGNOSIS — R609 Edema, unspecified: Secondary | ICD-10-CM | POA: Diagnosis not present

## 2020-03-12 DIAGNOSIS — N042 Nephrotic syndrome with diffuse membranous glomerulonephritis: Secondary | ICD-10-CM | POA: Diagnosis not present

## 2020-03-12 DIAGNOSIS — E559 Vitamin D deficiency, unspecified: Secondary | ICD-10-CM | POA: Diagnosis not present

## 2020-03-12 DIAGNOSIS — I129 Hypertensive chronic kidney disease with stage 1 through stage 4 chronic kidney disease, or unspecified chronic kidney disease: Secondary | ICD-10-CM | POA: Diagnosis not present

## 2020-03-12 DIAGNOSIS — N1831 Chronic kidney disease, stage 3a: Secondary | ICD-10-CM | POA: Diagnosis not present

## 2020-03-13 ENCOUNTER — Ambulatory Visit
Admission: RE | Admit: 2020-03-13 | Discharge: 2020-03-13 | Disposition: A | Discharge status: Home / Self Care | Payer: Medicare Other | Source: Ambulatory Visit | Attending: Acute Care | Admitting: Acute Care

## 2020-03-13 ENCOUNTER — Other Ambulatory Visit: Payer: Self-pay

## 2020-03-13 DIAGNOSIS — Z87891 Personal history of nicotine dependence: Secondary | ICD-10-CM

## 2020-03-13 DIAGNOSIS — I2510 Atherosclerotic heart disease of native coronary artery without angina pectoris: Secondary | ICD-10-CM | POA: Diagnosis not present

## 2020-03-13 DIAGNOSIS — I358 Other nonrheumatic aortic valve disorders: Secondary | ICD-10-CM | POA: Diagnosis not present

## 2020-03-13 DIAGNOSIS — J432 Centrilobular emphysema: Secondary | ICD-10-CM | POA: Diagnosis not present

## 2020-03-21 NOTE — Progress Notes (Signed)
Please call patient and let them  know their  low dose Ct was read as a Lung RADS 2: nodules that are benign in appearance and behavior with a very low likelihood of becoming a clinically active cancer due to size or lack of growth. Recommendation per radiology is for a repeat LDCT in 12 months. .Please let them  know we will order and schedule their  annual screening scan for 02/2021. Please let them  know there was notation of CAD on their  scan.  Please remind the patient  that this is a non-gated exam therefore degree or severity of disease  cannot be determined. Please have them  follow up with their PCP regarding potential risk factor modification, dietary therapy or pharmacologic therapy if clinically indicated. Pt.  is  currently on statin therapy. Please place order for annual  screening scan for 02/2021 and fax results to PCP. Thanks so much.  Langley Gauss, the radiologist recommends an echo as the patient has some aortic atherosclerosis. Please have the patient follow up with his PCP. Thanks so much.

## 2020-03-22 ENCOUNTER — Other Ambulatory Visit: Payer: Self-pay | Admitting: *Deleted

## 2020-03-22 DIAGNOSIS — Z87891 Personal history of nicotine dependence: Secondary | ICD-10-CM

## 2020-03-26 DIAGNOSIS — N1831 Chronic kidney disease, stage 3a: Secondary | ICD-10-CM | POA: Diagnosis not present

## 2020-03-27 DIAGNOSIS — N042 Nephrotic syndrome with diffuse membranous glomerulonephritis: Secondary | ICD-10-CM | POA: Diagnosis not present

## 2020-04-12 DIAGNOSIS — E559 Vitamin D deficiency, unspecified: Secondary | ICD-10-CM | POA: Diagnosis not present

## 2020-04-12 DIAGNOSIS — N042 Nephrotic syndrome with diffuse membranous glomerulonephritis: Secondary | ICD-10-CM | POA: Diagnosis not present

## 2020-04-12 DIAGNOSIS — N1831 Chronic kidney disease, stage 3a: Secondary | ICD-10-CM | POA: Diagnosis not present

## 2020-04-12 DIAGNOSIS — R609 Edema, unspecified: Secondary | ICD-10-CM | POA: Diagnosis not present

## 2020-04-12 DIAGNOSIS — I129 Hypertensive chronic kidney disease with stage 1 through stage 4 chronic kidney disease, or unspecified chronic kidney disease: Secondary | ICD-10-CM | POA: Diagnosis not present

## 2020-04-12 DIAGNOSIS — E785 Hyperlipidemia, unspecified: Secondary | ICD-10-CM | POA: Diagnosis not present

## 2020-05-07 DIAGNOSIS — N1831 Chronic kidney disease, stage 3a: Secondary | ICD-10-CM | POA: Diagnosis not present

## 2020-05-18 DIAGNOSIS — N1831 Chronic kidney disease, stage 3a: Secondary | ICD-10-CM | POA: Diagnosis not present

## 2020-05-18 DIAGNOSIS — R609 Edema, unspecified: Secondary | ICD-10-CM | POA: Diagnosis not present

## 2020-05-18 DIAGNOSIS — E559 Vitamin D deficiency, unspecified: Secondary | ICD-10-CM | POA: Diagnosis not present

## 2020-05-18 DIAGNOSIS — E785 Hyperlipidemia, unspecified: Secondary | ICD-10-CM | POA: Diagnosis not present

## 2020-05-18 DIAGNOSIS — N042 Nephrotic syndrome with diffuse membranous glomerulonephritis: Secondary | ICD-10-CM | POA: Diagnosis not present

## 2020-05-18 DIAGNOSIS — I129 Hypertensive chronic kidney disease with stage 1 through stage 4 chronic kidney disease, or unspecified chronic kidney disease: Secondary | ICD-10-CM | POA: Diagnosis not present

## 2020-06-18 DIAGNOSIS — N1831 Chronic kidney disease, stage 3a: Secondary | ICD-10-CM | POA: Diagnosis not present

## 2020-06-28 DIAGNOSIS — E559 Vitamin D deficiency, unspecified: Secondary | ICD-10-CM | POA: Diagnosis not present

## 2020-06-28 DIAGNOSIS — I129 Hypertensive chronic kidney disease with stage 1 through stage 4 chronic kidney disease, or unspecified chronic kidney disease: Secondary | ICD-10-CM | POA: Diagnosis not present

## 2020-06-28 DIAGNOSIS — E785 Hyperlipidemia, unspecified: Secondary | ICD-10-CM | POA: Diagnosis not present

## 2020-06-28 DIAGNOSIS — N042 Nephrotic syndrome with diffuse membranous glomerulonephritis: Secondary | ICD-10-CM | POA: Diagnosis not present

## 2020-06-28 DIAGNOSIS — N1831 Chronic kidney disease, stage 3a: Secondary | ICD-10-CM | POA: Diagnosis not present

## 2020-06-28 DIAGNOSIS — R609 Edema, unspecified: Secondary | ICD-10-CM | POA: Diagnosis not present

## 2020-07-12 ENCOUNTER — Other Ambulatory Visit: Payer: Self-pay | Admitting: Gastroenterology

## 2020-07-12 DIAGNOSIS — Z86010 Personal history of colonic polyps: Secondary | ICD-10-CM | POA: Diagnosis not present

## 2020-07-12 DIAGNOSIS — R1310 Dysphagia, unspecified: Secondary | ICD-10-CM

## 2020-07-12 DIAGNOSIS — K649 Unspecified hemorrhoids: Secondary | ICD-10-CM | POA: Diagnosis not present

## 2020-07-12 DIAGNOSIS — R1314 Dysphagia, pharyngoesophageal phase: Secondary | ICD-10-CM | POA: Diagnosis not present

## 2020-07-31 ENCOUNTER — Ambulatory Visit
Admission: RE | Admit: 2020-07-31 | Discharge: 2020-07-31 | Disposition: A | Discharge status: Home / Self Care | Payer: Medicare Other | Source: Ambulatory Visit | Attending: Gastroenterology | Admitting: Gastroenterology

## 2020-07-31 DIAGNOSIS — R1310 Dysphagia, unspecified: Secondary | ICD-10-CM

## 2020-08-27 DIAGNOSIS — N1831 Chronic kidney disease, stage 3a: Secondary | ICD-10-CM | POA: Diagnosis not present

## 2020-09-05 DIAGNOSIS — H903 Sensorineural hearing loss, bilateral: Secondary | ICD-10-CM | POA: Diagnosis not present

## 2020-09-06 DIAGNOSIS — R609 Edema, unspecified: Secondary | ICD-10-CM | POA: Diagnosis not present

## 2020-09-06 DIAGNOSIS — N1831 Chronic kidney disease, stage 3a: Secondary | ICD-10-CM | POA: Diagnosis not present

## 2020-09-06 DIAGNOSIS — N042 Nephrotic syndrome with diffuse membranous glomerulonephritis: Secondary | ICD-10-CM | POA: Diagnosis not present

## 2020-09-06 DIAGNOSIS — I129 Hypertensive chronic kidney disease with stage 1 through stage 4 chronic kidney disease, or unspecified chronic kidney disease: Secondary | ICD-10-CM | POA: Diagnosis not present

## 2020-09-07 DIAGNOSIS — K648 Other hemorrhoids: Secondary | ICD-10-CM | POA: Diagnosis not present

## 2020-09-18 DIAGNOSIS — Z86010 Personal history of colonic polyps: Secondary | ICD-10-CM | POA: Diagnosis not present

## 2020-09-18 DIAGNOSIS — K219 Gastro-esophageal reflux disease without esophagitis: Secondary | ICD-10-CM | POA: Diagnosis not present

## 2020-09-18 DIAGNOSIS — R1310 Dysphagia, unspecified: Secondary | ICD-10-CM | POA: Diagnosis not present

## 2020-09-18 DIAGNOSIS — D123 Benign neoplasm of transverse colon: Secondary | ICD-10-CM | POA: Diagnosis not present

## 2020-09-18 DIAGNOSIS — D120 Benign neoplasm of cecum: Secondary | ICD-10-CM | POA: Diagnosis not present

## 2020-09-24 DIAGNOSIS — D120 Benign neoplasm of cecum: Secondary | ICD-10-CM | POA: Diagnosis not present

## 2020-09-24 DIAGNOSIS — D123 Benign neoplasm of transverse colon: Secondary | ICD-10-CM | POA: Diagnosis not present

## 2020-09-24 DIAGNOSIS — K219 Gastro-esophageal reflux disease without esophagitis: Secondary | ICD-10-CM | POA: Diagnosis not present

## 2020-09-26 DIAGNOSIS — R1310 Dysphagia, unspecified: Secondary | ICD-10-CM | POA: Diagnosis not present

## 2020-09-26 DIAGNOSIS — K649 Unspecified hemorrhoids: Secondary | ICD-10-CM | POA: Diagnosis not present

## 2020-10-10 DIAGNOSIS — N042 Nephrotic syndrome with diffuse membranous glomerulonephritis: Secondary | ICD-10-CM | POA: Diagnosis not present

## 2020-10-25 DIAGNOSIS — N1831 Chronic kidney disease, stage 3a: Secondary | ICD-10-CM | POA: Diagnosis not present

## 2020-10-25 DIAGNOSIS — N042 Nephrotic syndrome with diffuse membranous glomerulonephritis: Secondary | ICD-10-CM | POA: Diagnosis not present

## 2020-11-05 DIAGNOSIS — R609 Edema, unspecified: Secondary | ICD-10-CM | POA: Diagnosis not present

## 2020-11-05 DIAGNOSIS — N1831 Chronic kidney disease, stage 3a: Secondary | ICD-10-CM | POA: Diagnosis not present

## 2020-11-05 DIAGNOSIS — N042 Nephrotic syndrome with diffuse membranous glomerulonephritis: Secondary | ICD-10-CM | POA: Diagnosis not present

## 2020-11-05 DIAGNOSIS — E559 Vitamin D deficiency, unspecified: Secondary | ICD-10-CM | POA: Diagnosis not present

## 2020-11-05 DIAGNOSIS — N049 Nephrotic syndrome with unspecified morphologic changes: Secondary | ICD-10-CM | POA: Diagnosis not present

## 2020-11-05 DIAGNOSIS — I129 Hypertensive chronic kidney disease with stage 1 through stage 4 chronic kidney disease, or unspecified chronic kidney disease: Secondary | ICD-10-CM | POA: Diagnosis not present

## 2020-11-11 DIAGNOSIS — I10 Essential (primary) hypertension: Secondary | ICD-10-CM | POA: Diagnosis not present

## 2020-12-31 DIAGNOSIS — N1831 Chronic kidney disease, stage 3a: Secondary | ICD-10-CM | POA: Diagnosis not present

## 2021-01-07 DIAGNOSIS — R609 Edema, unspecified: Secondary | ICD-10-CM | POA: Diagnosis not present

## 2021-01-07 DIAGNOSIS — N042 Nephrotic syndrome with diffuse membranous glomerulonephritis: Secondary | ICD-10-CM | POA: Diagnosis not present

## 2021-01-07 DIAGNOSIS — E559 Vitamin D deficiency, unspecified: Secondary | ICD-10-CM | POA: Diagnosis not present

## 2021-01-07 DIAGNOSIS — I129 Hypertensive chronic kidney disease with stage 1 through stage 4 chronic kidney disease, or unspecified chronic kidney disease: Secondary | ICD-10-CM | POA: Diagnosis not present

## 2021-01-07 DIAGNOSIS — N1831 Chronic kidney disease, stage 3a: Secondary | ICD-10-CM | POA: Diagnosis not present

## 2021-01-07 DIAGNOSIS — N049 Nephrotic syndrome with unspecified morphologic changes: Secondary | ICD-10-CM | POA: Diagnosis not present

## 2021-02-14 DIAGNOSIS — G4733 Obstructive sleep apnea (adult) (pediatric): Secondary | ICD-10-CM | POA: Diagnosis not present

## 2021-02-25 DIAGNOSIS — N1831 Chronic kidney disease, stage 3a: Secondary | ICD-10-CM | POA: Diagnosis not present

## 2021-03-05 DIAGNOSIS — R609 Edema, unspecified: Secondary | ICD-10-CM | POA: Diagnosis not present

## 2021-03-05 DIAGNOSIS — N042 Nephrotic syndrome with diffuse membranous glomerulonephritis: Secondary | ICD-10-CM | POA: Diagnosis not present

## 2021-03-05 DIAGNOSIS — N1831 Chronic kidney disease, stage 3a: Secondary | ICD-10-CM | POA: Diagnosis not present

## 2021-03-05 DIAGNOSIS — I129 Hypertensive chronic kidney disease with stage 1 through stage 4 chronic kidney disease, or unspecified chronic kidney disease: Secondary | ICD-10-CM | POA: Diagnosis not present

## 2021-03-05 DIAGNOSIS — E559 Vitamin D deficiency, unspecified: Secondary | ICD-10-CM | POA: Diagnosis not present

## 2021-03-05 DIAGNOSIS — N049 Nephrotic syndrome with unspecified morphologic changes: Secondary | ICD-10-CM | POA: Diagnosis not present

## 2021-03-11 DIAGNOSIS — G4733 Obstructive sleep apnea (adult) (pediatric): Secondary | ICD-10-CM | POA: Diagnosis not present

## 2021-03-11 DIAGNOSIS — R945 Abnormal results of liver function studies: Secondary | ICD-10-CM | POA: Diagnosis not present

## 2021-03-11 DIAGNOSIS — N049 Nephrotic syndrome with unspecified morphologic changes: Secondary | ICD-10-CM | POA: Diagnosis not present

## 2021-03-11 DIAGNOSIS — R151 Fecal smearing: Secondary | ICD-10-CM | POA: Diagnosis not present

## 2021-03-11 DIAGNOSIS — R946 Abnormal results of thyroid function studies: Secondary | ICD-10-CM | POA: Diagnosis not present

## 2021-03-11 DIAGNOSIS — I10 Essential (primary) hypertension: Secondary | ICD-10-CM | POA: Diagnosis not present

## 2021-03-25 ENCOUNTER — Other Ambulatory Visit: Payer: Self-pay

## 2021-03-25 DIAGNOSIS — Z87891 Personal history of nicotine dependence: Secondary | ICD-10-CM

## 2021-04-11 ENCOUNTER — Ambulatory Visit
Admission: RE | Admit: 2021-04-11 | Discharge: 2021-04-11 | Disposition: A | Discharge status: Home / Self Care | Payer: Medicare Other | Source: Ambulatory Visit | Attending: Acute Care | Admitting: Acute Care

## 2021-04-11 ENCOUNTER — Other Ambulatory Visit: Payer: Self-pay

## 2021-04-11 DIAGNOSIS — Z87891 Personal history of nicotine dependence: Secondary | ICD-10-CM | POA: Diagnosis not present

## 2021-04-11 IMAGING — CT CT CHEST LUNG CANCER SCREENING LOW DOSE W/O CM
1 of 3 series · 9 of 40 positions shown, 12 images · non-contrast
Comparison: 03/13/2020 screening chest CT.

CLINICAL DATA: 70-year-old asymptomatic male former smoker with 69
pack-year smoking history, quit smoking 9 years prior.



[ct lung segmentation data · axial · 0.77mm/px · z∈[-416,-416]mm · 9 of 360 frames shown]
[frame 1/360  mediastinal]
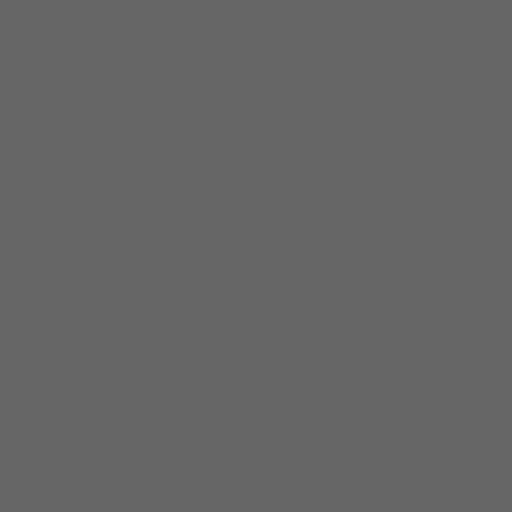
[frame 1/360  lung]
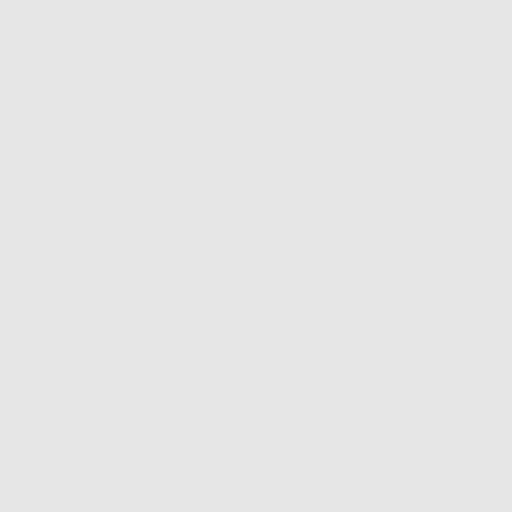
[frame 40/360  lung]
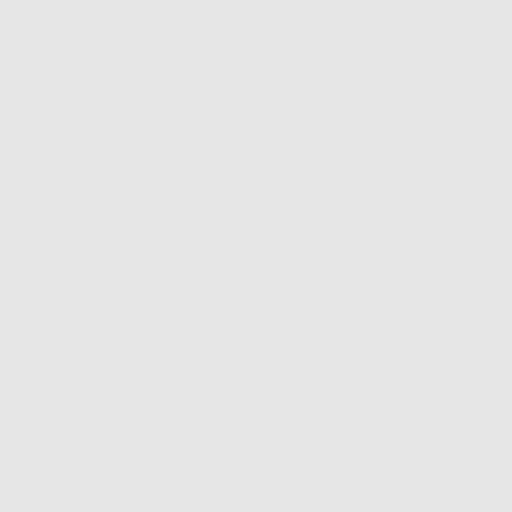
[frame 80/360  lung]
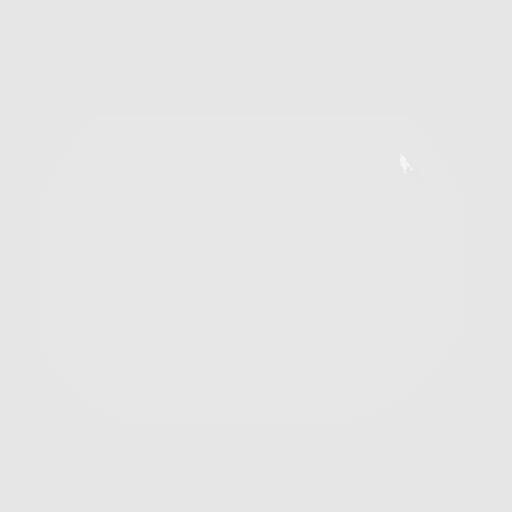
[frame 120/360  lung]
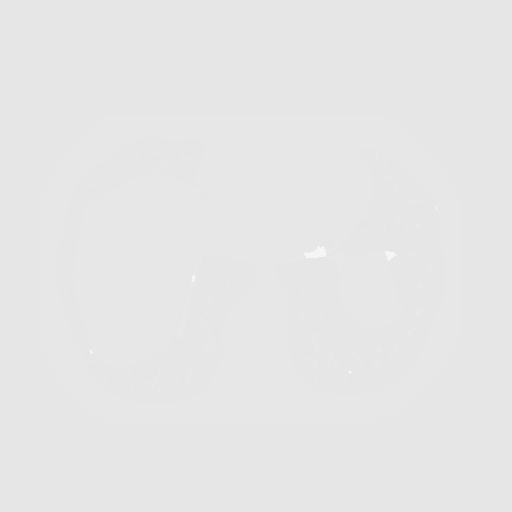
[frame 160/360  mediastinal]
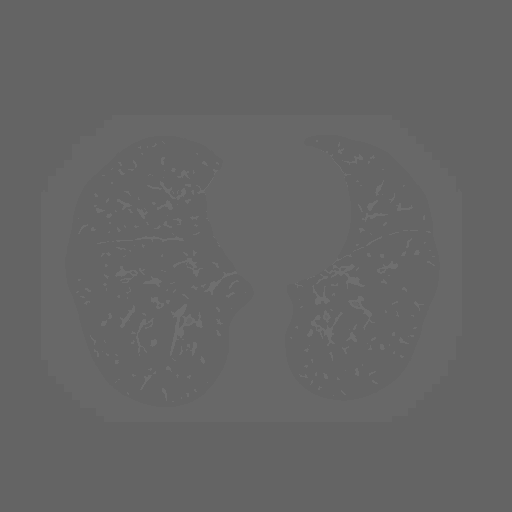
[frame 160/360  lung]
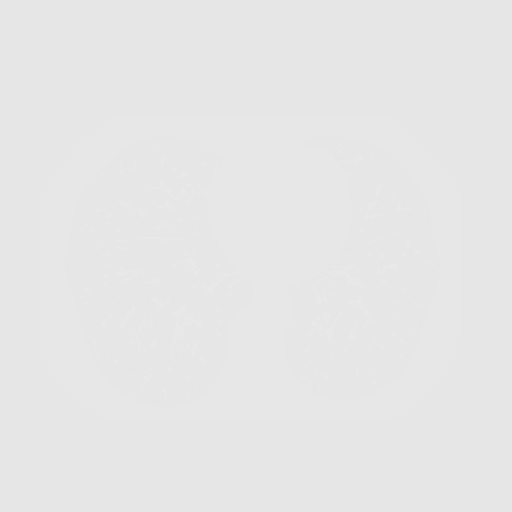
[frame 200/360  lung]
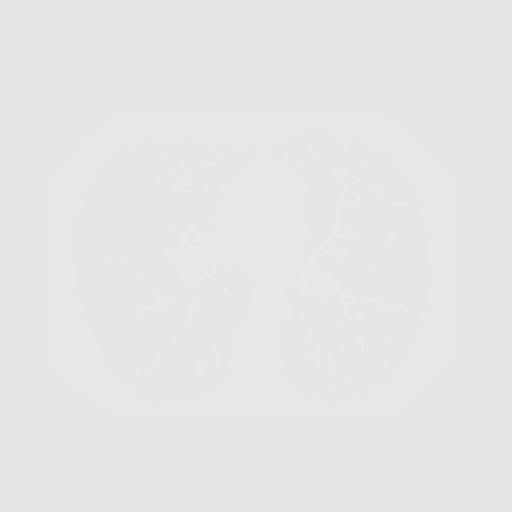
[frame 240/360  lung]
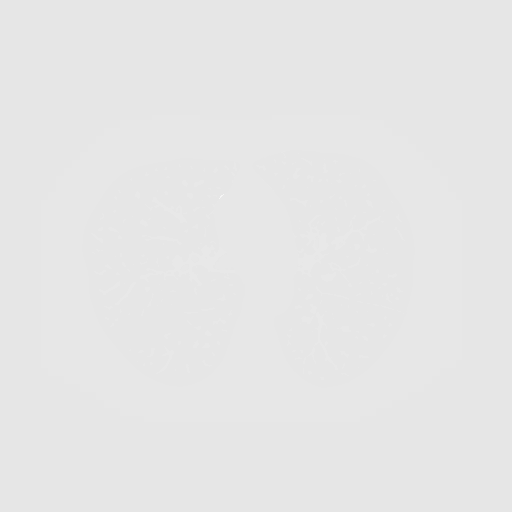
[frame 280/360  lung]
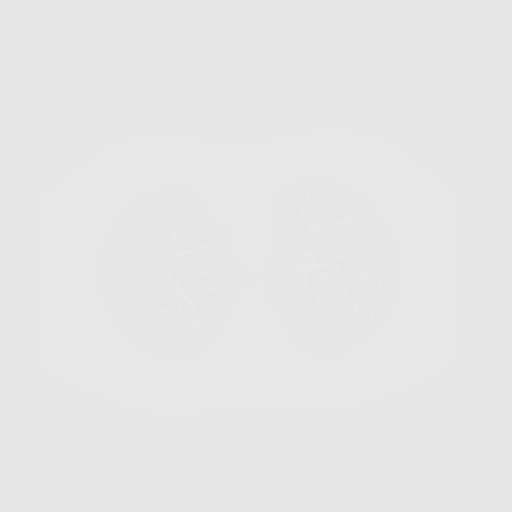
[frame 320/360  mediastinal]
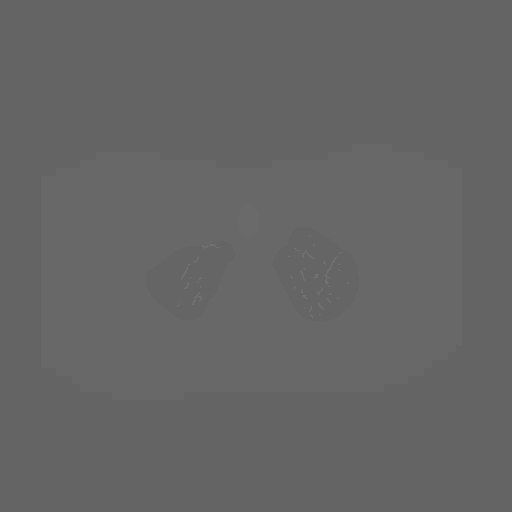
[frame 320/360  lung]
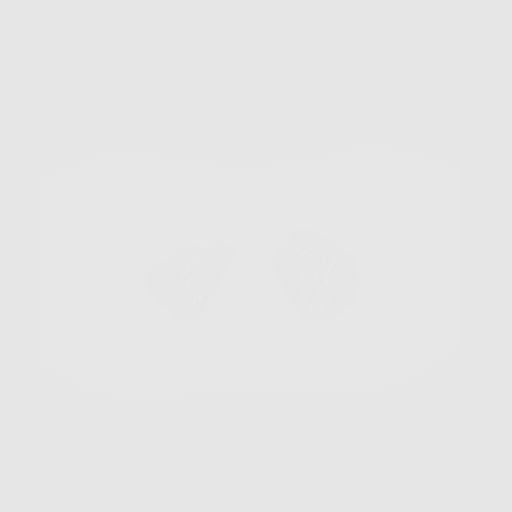

[9 of 40 positions shown; findings below may reference images not displayed]

FINDINGS: Cardiovascular: Normal heart size. No significant pericardial
effusion/thickening. Three-vessel coronary atherosclerosis.
Atherosclerotic nonaneurysmal thoracic aorta. Normal caliber
pulmonary arteries.

Mediastinum/Nodes: No discrete thyroid nodules. Unremarkable
esophagus. No pathologically enlarged axillary, mediastinal or hilar
lymph nodes, noting limited sensitivity for the detection of hilar
adenopathy on this noncontrast study.

Lungs/Pleura: No pneumothorax. No pleural effusion. Moderate
paraseptal and centrilobular emphysema with diffuse bronchial wall
thickening. No acute consolidative airspace disease or lung masses.
No significant growth of previously visualized pulmonary nodules. No
new significant pulmonary nodules.

Upper abdomen: Cholelithiasis.

Musculoskeletal: No aggressive appearing focal osseous lesions.
Moderate thoracic spondylosis.
IMPRESSION: 1. Lung-RADS 2, benign appearance or behavior. Continue annual
screening with low-dose chest CT without contrast in 12 months.
2. Three-vessel coronary atherosclerosis.
3. Cholelithiasis.
4. Aortic Atherosclerosis (2BIHB-KSI.I) and Emphysema (2BIHB-107.B).

## 2021-04-15 ENCOUNTER — Other Ambulatory Visit: Payer: Self-pay

## 2021-04-15 DIAGNOSIS — Z87891 Personal history of nicotine dependence: Secondary | ICD-10-CM

## 2021-05-31 DIAGNOSIS — N1831 Chronic kidney disease, stage 3a: Secondary | ICD-10-CM | POA: Diagnosis not present

## 2021-05-31 DIAGNOSIS — N189 Chronic kidney disease, unspecified: Secondary | ICD-10-CM | POA: Diagnosis not present

## 2021-06-06 DIAGNOSIS — N1831 Chronic kidney disease, stage 3a: Secondary | ICD-10-CM | POA: Diagnosis not present

## 2021-06-06 DIAGNOSIS — I129 Hypertensive chronic kidney disease with stage 1 through stage 4 chronic kidney disease, or unspecified chronic kidney disease: Secondary | ICD-10-CM | POA: Diagnosis not present

## 2021-06-06 DIAGNOSIS — E559 Vitamin D deficiency, unspecified: Secondary | ICD-10-CM | POA: Diagnosis not present

## 2021-06-06 DIAGNOSIS — N042 Nephrotic syndrome with diffuse membranous glomerulonephritis: Secondary | ICD-10-CM | POA: Diagnosis not present

## 2021-06-06 DIAGNOSIS — N049 Nephrotic syndrome with unspecified morphologic changes: Secondary | ICD-10-CM | POA: Diagnosis not present

## 2021-06-06 DIAGNOSIS — R609 Edema, unspecified: Secondary | ICD-10-CM | POA: Diagnosis not present

## 2021-06-13 DIAGNOSIS — H25043 Posterior subcapsular polar age-related cataract, bilateral: Secondary | ICD-10-CM | POA: Diagnosis not present

## 2021-06-19 DIAGNOSIS — Z20822 Contact with and (suspected) exposure to covid-19: Secondary | ICD-10-CM | POA: Diagnosis not present

## 2021-07-02 ENCOUNTER — Ambulatory Visit: Payer: Self-pay | Admitting: Surgery

## 2021-07-02 DIAGNOSIS — K648 Other hemorrhoids: Secondary | ICD-10-CM | POA: Diagnosis not present

## 2021-07-02 NOTE — H&P (Signed)
History of Present Illness: ?Ryan Rubio is a 71 y.o. male who is seen today for follow up of hemorrhoids. He was last here in July 2022, at which time he was having drainage and occasional bleeding. He had a colonoscopy shortly after that visit, and was told it was normal. He now feels a bulge daily, which is not painful but causes him discomfort, particularly when sitting. He has regular bowel movements and does not strain. ?  ?  ?Review of Systems: ?A complete review of systems was obtained from the patient.  I have reviewed this information and discussed as appropriate with the patient.  See HPI as well for other ROS. ?  ?  ?Medical History: ?Past Medical History: ?Diagnosis Date ? Chronic kidney disease   ? Sleep apnea   ?  ?  ?Patient Active Problem List ?Diagnosis ? Hemorrhoid ?  ?  ?History reviewed. No pertinent surgical history.  ?  ?No Known Allergies ?  ?Current Outpatient Medications on File Prior to Visit ?Medication Sig Dispense Refill ? cholecalciferol (VITAMIN D3) 1000 unit tablet Take by mouth     ? losartan (COZAAR) 50 MG tablet Take by mouth     ? pravastatin (PRAVACHOL) 40 MG tablet Take 40 mg by mouth once daily     ?  ?No current facility-administered medications on file prior to visit. ?  ?  ?History reviewed. No pertinent family history.  ?  ?Social History ?  ?Tobacco Use ?Smoking Status Former ?Smokeless Tobacco Never ?  ?  ?Social History ?  ?Socioeconomic History ? Marital status: Married ?Tobacco Use ? Smoking status: Former ? Smokeless tobacco: Never ?Vaping Use ? Vaping Use: Never used ?Substance and Sexual Activity ? Alcohol use: Yes ? Drug use: Defer ? Sexual activity: Defer ?  ?  ?Objective: ?  ?  ?Vitals: ?  07/02/21 0847 ?BP: (!) 140/80 ?Pulse: 76 ?Temp: 37.1 ?C (98.7 ?F) ?SpO2: 98% ?Weight: 76.9 kg (169 lb 9.6 oz) ?Height: 175.3 cm ('5\' 9"'$ ) ?  ?Body mass index is 25.05 kg/m?. ?  ?Physical Exam ?Vitals reviewed.  ?Constitutional:   ?   General: He is not in acute  distress. ?HENT:  ?   Head: Normocephalic and atraumatic.  ?Eyes:  ?   General: No scleral icterus. ?   Conjunctiva/sclera: Conjunctivae normal.  ?Pulmonary:  ?   Effort: Pulmonary effort is normal. No respiratory distress.  ?Genitourinary: ?   Comments: External hemorrhoids, non-thrombosed. There is prolapse of left lateral internal hemorrhoid, reducible. No masses on digital rectal exam, no blood in rectal vault. Anoscopy attempted but not performed due to significant patient discomfort. ?Skin: ?   General: Skin is warm and dry.  ?Neurological:  ?   General: No focal deficit present.  ?   Mental Status: He is alert and oriented to person, place, and time.  ?Psychiatric:     ?   Mood and Affect: Mood normal.     ?   Behavior: Behavior normal.     ?   Thought Content: Thought content normal.  ?  ?  ? ?  ?Assessment and Plan: ?   ?Diagnoses and all orders for this visit: ?  ?Hemorrhoids, internal ?  ?  ?This is a 71 yo male with a grade III internal hemorrhoid, with frequent symptoms. His symptoms have not improved with nonoperative measures, and the hemorrhoid appears too large and inflamed on exam today to be amenable to band ligation. I recommended surgical hemorrhoidectomy, with a rectal  exam under anesthesia, and I reviewed the details of this procedure with the patient and the expected postoperative recovery. I will plan to remove the left lateral internal hemorrhoid and will assess the right sided hemorrhoids intraop, but discussed with him that I do not recommend excision of all three internal hemorrhoids. He will be travelling to [place] at the end of June to help his daughter after her surgery. We will schedule his hemorrhoidectomy by the end of this month so he has time to recover prior to traveling. ?- Rectal prep instructions provided  ?- Will obtain most recent colonoscopy report from [place] ?- Patient will be contacted to schedule an elective surgery date ? ?Michaelle Birks, MD ?[facility]  Surgery ?General, Hepatobiliary and Pancreatic Surgery ?07/02/21 9:32 AM ? ?

## 2021-07-02 NOTE — H&P (View-Only) (Signed)
History of Present Illness: Ryan Rubio is a 71 y.o. male who is seen today for follow up of hemorrhoids. He was last here in July 2022, at which time he was having drainage and occasional bleeding. He had a colonoscopy shortly after that visit, and was told it was normal. He now feels a bulge daily, which is not painful but causes him discomfort, particularly when sitting. He has regular bowel movements and does not strain.     Review of Systems: A complete review of systems was obtained from the patient.  I have reviewed this information and discussed as appropriate with the patient.  See HPI as well for other ROS.     Medical History: Past Medical History: Diagnosis Date  Chronic kidney disease    Sleep apnea       Patient Active Problem List Diagnosis  Hemorrhoid     History reviewed. No pertinent surgical history.    No Known Allergies   Current Outpatient Medications on File Prior to Visit Medication Sig Dispense Refill  cholecalciferol (VITAMIN D3) 1000 unit tablet Take by mouth      losartan (COZAAR) 50 MG tablet Take by mouth      pravastatin (PRAVACHOL) 40 MG tablet Take 40 mg by mouth once daily       No current facility-administered medications on file prior to visit.     History reviewed. No pertinent family history.    Social History   Tobacco Use Smoking Status Former Smokeless Tobacco Never     Social History   Socioeconomic History  Marital status: Married Tobacco Use  Smoking status: Former  Smokeless tobacco: Never Surveyor, mining Use: Never used Substance and Sexual Activity  Alcohol use: Yes  Drug use: Defer  Sexual activity: Defer     Objective:     Vitals:   07/02/21 0847 BP: (!) 140/80 Pulse: 76 Temp: 37.1 C (98.7 F) SpO2: 98% Weight: 76.9 kg (169 lb 9.6 oz) Height: 175.3 cm ('5\' 9"'$ )   Body mass index is 25.05 kg/m.   Physical Exam Vitals reviewed.  Constitutional:      General: He is not in acute  distress. HENT:     Head: Normocephalic and atraumatic.  Eyes:     General: No scleral icterus.    Conjunctiva/sclera: Conjunctivae normal.  Pulmonary:     Effort: Pulmonary effort is normal. No respiratory distress.  Genitourinary:    Comments: External hemorrhoids, non-thrombosed. There is prolapse of left lateral internal hemorrhoid, reducible. No masses on digital rectal exam, no blood in rectal vault. Anoscopy attempted but not performed due to significant patient discomfort. Skin:    General: Skin is warm and dry.  Neurological:     General: No focal deficit present.     Mental Status: He is alert and oriented to person, place, and time.  Psychiatric:        Mood and Affect: Mood normal.        Behavior: Behavior normal.        Thought Content: Thought content normal.         Assessment and Plan:    Diagnoses and all orders for this visit:   Hemorrhoids, internal     This is a 72 yo male with a grade III internal hemorrhoid, with frequent symptoms. His symptoms have not improved with nonoperative measures, and the hemorrhoid appears too large and inflamed on exam today to be amenable to band ligation. I recommended surgical hemorrhoidectomy, with a rectal  exam under anesthesia, and I reviewed the details of this procedure with the patient and the expected postoperative recovery. I will plan to remove the left lateral internal hemorrhoid and will assess the right sided hemorrhoids intraop, but discussed with him that I do not recommend excision of all three internal hemorrhoids. He will be travelling to [place] at the end of June to help his daughter after her surgery. We will schedule his hemorrhoidectomy by the end of this month so he has time to recover prior to traveling. - Rectal prep instructions provided  - Will obtain most recent colonoscopy report from [facility] - Patient will be contacted to schedule an elective surgery date  Michaelle Birks, MD [place], Hepatobiliary and Pancreatic Surgery 07/02/21 9:32 AM

## 2021-07-03 DIAGNOSIS — N042 Nephrotic syndrome with diffuse membranous glomerulonephritis: Secondary | ICD-10-CM | POA: Diagnosis not present

## 2021-07-04 NOTE — Patient Instructions (Addendum)
DUE TO COVID-19 ONLY TWO VISITORS  (aged 73 and older)  ARE ALLOWED TO COME WITH YOU AND STAY IN THE WAITING ROOM ONLY DURING PRE OP AND PROCEDURE.   **NO VISITORS ARE ALLOWED IN THE SHORT STAY AREA OR RECOVERY ROOM!!**  IF YOU WILL BE ADMITTED INTO THE HOSPITAL YOU ARE ALLOWED ONLY FOUR SUPPORT PEOPLE DURING VISITATION HOURS ONLY (7 AM -8PM)   The support person(s) must pass our screening, gel in and out, and wear a mask at all times, including in the patient's room. Patients must also wear a mask when staff or their support person are in the room. Visitors GUEST BADGE MUST BE WORN VISIBLY  One adult visitor may remain with you overnight and MUST be in the room by 8 P.M.     Your procedure is scheduled on: 07/10/21   Report to [facility] Main Entrance    Report to admitting at : 9:15 AM   Call this number if you have problems the morning of surgery [phone]   Do not eat food :After Midnight.   After Midnight you may have the following liquids until: 8:30 AM DAY OF SURGERY  Water Black Coffee (sugar ok, NO MILK/CREAM OR CREAMERS)  Tea (sugar ok, NO MILK/CREAM OR CREAMERS) regular and decaf                             Plain Jell-O (NO RED)                                           Fruit ices (not with fruit pulp, NO RED)                                     Popsicles (NO RED)                                                                  Juice: apple, WHITE grape, WHITE cranberry Sports drinks like Gatorade (NO RED) Clear broth(vegetable,chicken,beef)  FOLLOW BOWEL PREP AND ANY ADDITIONAL PRE OP INSTRUCTIONS YOU RECEIVED FROM YOUR SURGEON'S OFFICE!!!   Oral Hygiene is also important to reduce your risk of infection.                                    Remember - BRUSH YOUR TEETH THE MORNING OF SURGERY WITH YOUR REGULAR TOOTHPASTE   Do NOT smoke after Midnight   Take these medicines the morning of surgery with A SIP OF WATER: N/A  DO NOT TAKE ANY ORAL DIABETIC  MEDICATIONS DAY OF YOUR SURGERY  Bring CPAP mask and tubing day of surgery.                              You may not have any metal on your body including hair pins, jewelry, and body piercing             Do  not wear lotions, powders, perfumes/cologne, or deodorant              Men may shave face and neck.   Do not bring valuables to the hospital. [place].   Contacts, dentures or bridgework may not be worn into surgery.   Bring small overnight bag day of surgery.    Patients discharged on the day of surgery will not be allowed to drive home.  Someone NEEDS to stay with you for the first 24 hours after anesthesia.   Special Instructions: Bring a copy of your healthcare power of attorney and living will documents         the day of surgery if you haven't scanned them before.              Please read over the following fact sheets you were given: IF YOU HAVE QUESTIONS ABOUT YOUR PRE-OP INSTRUCTIONS PLEASE CALL [phone]     [facility] - Preparing for Surgery Before surgery, you can play an important role.  Because skin is not sterile, your skin needs to be as free of germs as possible.  You can reduce the number of germs on your skin by washing with CHG (chlorahexidine gluconate) soap before surgery.  CHG is an antiseptic cleaner which kills germs and bonds with the skin to continue killing germs even after washing. Please DO NOT use if you have an allergy to CHG or antibacterial soaps.  If your skin becomes reddened/irritated stop using the CHG and inform your nurse when you arrive at Short Stay. Do not shave (including legs and underarms) for at least 48 hours prior to the first CHG shower.  You may shave your face/neck. Please follow these instructions carefully:  1.  Shower with CHG Soap the night before surgery and the  morning of Surgery.  2.  If you choose to wash your hair, wash your hair first as usual with your  normal   shampoo.  3.  After you shampoo, rinse your hair and body thoroughly to remove the  shampoo.                           4.  Use CHG as you would any other liquid soap.  You can apply chg directly  to the skin and wash                       Gently with a scrungie or clean washcloth.  5.  Apply the CHG Soap to your body ONLY FROM THE NECK DOWN.   Do not use on face/ open                           Wound or open sores. Avoid contact with eyes, ears mouth and genitals (private parts).                       Wash face,  Genitals (private parts) with your normal soap.             6.  Wash thoroughly, paying special attention to the area where your surgery  will be performed.  7.  Thoroughly rinse your body with warm water from the neck down.  8.  DO NOT shower/wash with your normal soap after  using and rinsing off  the CHG Soap.                9.  Pat yourself dry with a clean towel.            10.  Wear clean pajamas.            11.  Place clean sheets on your bed the night of your first shower and do not  sleep with pets. Day of Surgery : Do not apply any lotions/deodorants the morning of surgery.  Please wear clean clothes to the hospital/surgery center.  FAILURE TO FOLLOW THESE INSTRUCTIONS MAY RESULT IN THE CANCELLATION OF YOUR SURGERY PATIENT SIGNATURE_________________________________  NURSE SIGNATURE__________________________________  ________________________________________________________________________

## 2021-07-05 ENCOUNTER — Encounter (HOSPITAL_COMMUNITY)
Admission: RE | Admit: 2021-07-05 | Discharge: 2021-07-05 | Disposition: A | Discharge status: Home / Self Care | Payer: Medicare Other | Source: Ambulatory Visit | Attending: Surgery | Admitting: Surgery

## 2021-07-05 ENCOUNTER — Other Ambulatory Visit: Payer: Self-pay

## 2021-07-05 ENCOUNTER — Encounter (HOSPITAL_COMMUNITY): Payer: Self-pay

## 2021-07-05 VITALS — BP 172/92 | HR 64 | Temp 98.1°F | Ht 69.5 in | Wt 172.0 lb

## 2021-07-05 DIAGNOSIS — I2510 Atherosclerotic heart disease of native coronary artery without angina pectoris: Secondary | ICD-10-CM

## 2021-07-05 DIAGNOSIS — Z01818 Encounter for other preprocedural examination: Secondary | ICD-10-CM | POA: Insufficient documentation

## 2021-07-05 HISTORY — DX: Chronic kidney disease, unspecified: N18.9

## 2021-07-05 HISTORY — DX: Essential (primary) hypertension: I10

## 2021-07-05 LAB — CBC
HCT: 38.5 % — ABNORMAL LOW (ref 39.0–52.0)
Hemoglobin: 12.4 g/dL — ABNORMAL LOW (ref 13.0–17.0)
MCH: 30.8 pg (ref 26.0–34.0)
MCHC: 32.2 g/dL (ref 30.0–36.0)
MCV: 95.5 fL (ref 80.0–100.0)
Platelets: 173 10*3/uL (ref 150–400)
RBC: 4.03 MIL/uL — ABNORMAL LOW (ref 4.22–5.81)
RDW: 12.8 % (ref 11.5–15.5)
WBC: 7.2 10*3/uL (ref 4.0–10.5)
nRBC: 0 % (ref 0.0–0.2)

## 2021-07-05 LAB — BASIC METABOLIC PANEL
Anion gap: 5 (ref 5–15)
BUN: 25 mg/dL — ABNORMAL HIGH (ref 8–23)
CO2: 24 mmol/L (ref 22–32)
Calcium: 8.9 mg/dL (ref 8.9–10.3)
Chloride: 117 mmol/L — ABNORMAL HIGH (ref 98–111)
Creatinine, Ser: 2.25 mg/dL — ABNORMAL HIGH (ref 0.61–1.24)
GFR, Estimated: 30 mL/min — ABNORMAL LOW (ref >60)
Glucose, Bld: 107 mg/dL — ABNORMAL HIGH (ref 70–99)
Potassium: 4.4 mmol/L (ref 3.5–5.1)
Sodium: 146 mmol/L — ABNORMAL HIGH (ref 135–145)

## 2021-07-05 NOTE — Progress Notes (Signed)
For Short Stay: [facility] appointment date: Date of COVID positive in last 31 days:  Bowel Prep reminder: Reviewed    For Anesthesia: PCP - Anselm Lis: NP Cardiologist -   Chest x-ray - 04/11/21 EKG -  Stress Test -  ECHO -  Cardiac Cath -  Pacemaker/ICD device last checked: Pacemaker orders received: Device Rep notified:  Spinal Cord Stimulator:  Sleep Study - Yes CPAP - Yes  Fasting Blood Sugar - N/A Checks Blood Sugar _____ times a day Date and result of last Hgb A1c-  Blood Thinner Instructions: Aspirin Instructions: Last Dose:  Activity level: Can go up a flight of stairs and activities of daily living without stopping and without chest pain and/or shortness of breath   Able to exercise without chest pain and/or shortness of breath   Unable to go up a flight of stairs without chest pain and/or shortness of breath     Anesthesia review: Hx: HTN,OSA(CPAP)  Patient denies shortness of breath, fever, cough and chest pain at PAT appointment   Patient verbalized understanding of instructions that were given to them at the PAT appointment. Patient was also instructed that they will need to review over the PAT instructions again at home before surgery.

## 2021-07-08 DIAGNOSIS — N042 Nephrotic syndrome with diffuse membranous glomerulonephritis: Secondary | ICD-10-CM | POA: Diagnosis not present

## 2021-07-10 ENCOUNTER — Encounter (HOSPITAL_COMMUNITY): Payer: Self-pay | Admitting: Surgery

## 2021-07-10 ENCOUNTER — Ambulatory Visit (HOSPITAL_BASED_OUTPATIENT_CLINIC_OR_DEPARTMENT_OTHER): Payer: Medicare Other | Admitting: Certified Registered"

## 2021-07-10 ENCOUNTER — Other Ambulatory Visit: Payer: Self-pay

## 2021-07-10 ENCOUNTER — Ambulatory Visit (HOSPITAL_COMMUNITY): Payer: Medicare Other | Admitting: Certified Registered"

## 2021-07-10 ENCOUNTER — Ambulatory Visit (HOSPITAL_COMMUNITY)
Admission: RE | Admit: 2021-07-10 | Discharge: 2021-07-10 | Disposition: A | Discharge status: Home / Self Care | Payer: Medicare Other | Attending: Surgery | Admitting: Surgery

## 2021-07-10 ENCOUNTER — Encounter (HOSPITAL_COMMUNITY)
Admission: RE | Disposition: A | Discharge status: Home / Self Care | Payer: Self-pay | Source: Home / Self Care | Attending: Surgery

## 2021-07-10 DIAGNOSIS — K642 Third degree hemorrhoids: Secondary | ICD-10-CM | POA: Insufficient documentation

## 2021-07-10 DIAGNOSIS — K643 Fourth degree hemorrhoids: Secondary | ICD-10-CM

## 2021-07-10 DIAGNOSIS — G4730 Sleep apnea, unspecified: Secondary | ICD-10-CM | POA: Diagnosis not present

## 2021-07-10 DIAGNOSIS — I10 Essential (primary) hypertension: Secondary | ICD-10-CM | POA: Diagnosis not present

## 2021-07-10 DIAGNOSIS — K648 Other hemorrhoids: Secondary | ICD-10-CM | POA: Diagnosis not present

## 2021-07-10 DIAGNOSIS — Z87891 Personal history of nicotine dependence: Secondary | ICD-10-CM | POA: Insufficient documentation

## 2021-07-10 HISTORY — PX: RECTAL EXAM UNDER ANESTHESIA: SHX6399

## 2021-07-10 HISTORY — PX: HEMORRHOID SURGERY: SHX153

## 2021-07-10 SURGERY — HEMORRHOIDECTOMY PROLAPSED
Anesthesia: General

## 2021-07-10 MED ORDER — METRONIDAZOLE 500 MG/100ML IV SOLN
500.0000 mg | INTRAVENOUS | Status: AC
  Administered 2021-07-10: 500 mg via INTRAVENOUS
  Filled 2021-07-10: qty 100

## 2021-07-10 MED ORDER — PROPOFOL 10 MG/ML IV BOLUS
INTRAVENOUS | Status: DC | PRN
  Administered 2021-07-10: 30 mg via INTRAVENOUS
  Administered 2021-07-10: 150 mg via INTRAVENOUS

## 2021-07-10 MED ORDER — FLEET ENEMA 7-19 GM/118ML RE ENEM
1.0000 | ENEMA | Freq: Once | RECTAL | Status: DC
  Filled 2021-07-10: qty 1

## 2021-07-10 MED ORDER — ROCURONIUM BROMIDE 100 MG/10ML IV SOLN
INTRAVENOUS | Status: DC | PRN
  Administered 2021-07-10: 60 mg via INTRAVENOUS

## 2021-07-10 MED ORDER — ACETAMINOPHEN 500 MG PO TABS
1000.0000 mg | ORAL_TABLET | ORAL | Status: AC
  Administered 2021-07-10: 1000 mg via ORAL
  Filled 2021-07-10: qty 2

## 2021-07-10 MED ORDER — ACETAMINOPHEN 500 MG PO TABS: 1000.0000 mg | ORAL_TABLET | Freq: Three times a day (TID) | ORAL | 0 refills | Status: AC

## 2021-07-10 MED ORDER — GABAPENTIN 300 MG PO CAPS
300.0000 mg | ORAL_CAPSULE | ORAL | Status: AC
  Administered 2021-07-10: 300 mg via ORAL
  Filled 2021-07-10: qty 1

## 2021-07-10 MED ORDER — FENTANYL CITRATE (PF) 100 MCG/2ML IJ SOLN
INTRAMUSCULAR | Status: AC
  Filled 2021-07-10: qty 2

## 2021-07-10 MED ORDER — POLYETHYLENE GLYCOL 3350 17 G PO PACK: 17.0000 g | PACK | Freq: Every day | ORAL | 0 refills | Status: DC | PRN

## 2021-07-10 MED ORDER — CEFAZOLIN SODIUM-DEXTROSE 2-4 GM/100ML-% IV SOLN
2.0000 g | INTRAVENOUS | Status: AC
  Administered 2021-07-10: 2 g via INTRAVENOUS
  Filled 2021-07-10: qty 100

## 2021-07-10 MED ORDER — ACETAMINOPHEN 325 MG PO TABS: 325.0000 mg | ORAL_TABLET | ORAL | Status: DC | PRN

## 2021-07-10 MED ORDER — OXYCODONE HCL 5 MG/5ML PO SOLN: 5.0000 mg | Freq: Once | ORAL | Status: DC | PRN

## 2021-07-10 MED ORDER — ORAL CARE MOUTH RINSE: 15.0000 mL | Freq: Once | OROMUCOSAL | Status: AC

## 2021-07-10 MED ORDER — BUPIVACAINE LIPOSOME 1.3 % IJ SUSP
INTRAMUSCULAR | Status: DC | PRN
  Administered 2021-07-10: 20 mL

## 2021-07-10 MED ORDER — MIDAZOLAM HCL 5 MG/5ML IJ SOLN
INTRAMUSCULAR | Status: DC | PRN
  Administered 2021-07-10: 1 mg via INTRAVENOUS

## 2021-07-10 MED ORDER — BUPIVACAINE-EPINEPHRINE 0.5% -1:200000 IJ SOLN
INTRAMUSCULAR | Status: DC | PRN
  Administered 2021-07-10: 20 mL

## 2021-07-10 MED ORDER — MEPERIDINE HCL 50 MG/ML IJ SOLN: 6.2500 mg | INTRAMUSCULAR | Status: DC | PRN

## 2021-07-10 MED ORDER — FENTANYL CITRATE (PF) 100 MCG/2ML IJ SOLN
INTRAMUSCULAR | Status: DC | PRN
  Administered 2021-07-10 (×2): 50 ug via INTRAVENOUS

## 2021-07-10 MED ORDER — MIDAZOLAM HCL 2 MG/2ML IJ SOLN
INTRAMUSCULAR | Status: AC
Start: 2021-07-10 — End: ?
  Filled 2021-07-10: qty 2

## 2021-07-10 MED ORDER — OXYCODONE HCL 5 MG PO TABS: 5.0000 mg | ORAL_TABLET | ORAL | 0 refills | Status: DC | PRN

## 2021-07-10 MED ORDER — SUGAMMADEX SODIUM 200 MG/2ML IV SOLN
INTRAVENOUS | Status: DC | PRN
  Administered 2021-07-10: 200 mg via INTRAVENOUS

## 2021-07-10 MED ORDER — ONDANSETRON HCL 4 MG/2ML IJ SOLN: 4.0000 mg | Freq: Once | INTRAMUSCULAR | Status: DC | PRN

## 2021-07-10 MED ORDER — LACTATED RINGERS IV SOLN: INTRAVENOUS | Status: DC

## 2021-07-10 MED ORDER — FENTANYL CITRATE PF 50 MCG/ML IJ SOSY: 25.0000 ug | PREFILLED_SYRINGE | INTRAMUSCULAR | Status: DC | PRN

## 2021-07-10 MED ORDER — ONDANSETRON HCL 4 MG/2ML IJ SOLN
INTRAMUSCULAR | Status: DC | PRN
  Administered 2021-07-10: 4 mg via INTRAVENOUS

## 2021-07-10 MED ORDER — ACETAMINOPHEN 160 MG/5ML PO SOLN: 325.0000 mg | ORAL | Status: DC | PRN

## 2021-07-10 MED ORDER — BUPIVACAINE HCL (PF) 0.5 % IJ SOLN
INTRAMUSCULAR | Status: AC
  Filled 2021-07-10: qty 30

## 2021-07-10 MED ORDER — LIDOCAINE 2% (20 MG/ML) 5 ML SYRINGE
INTRAMUSCULAR | Status: DC | PRN
Start: 2021-07-10 — End: 2021-07-10
  Administered 2021-07-10: 100 mg via INTRAVENOUS

## 2021-07-10 MED ORDER — OXYCODONE HCL 5 MG PO TABS: 5.0000 mg | ORAL_TABLET | Freq: Once | ORAL | Status: DC | PRN

## 2021-07-10 MED ORDER — KETOROLAC TROMETHAMINE 15 MG/ML IJ SOLN: 15.0000 mg | Freq: Once | INTRAMUSCULAR | Status: DC

## 2021-07-10 MED ORDER — DEXAMETHASONE SODIUM PHOSPHATE 10 MG/ML IJ SOLN
INTRAMUSCULAR | Status: DC | PRN
  Administered 2021-07-10: 5 mg via INTRAVENOUS

## 2021-07-10 MED ORDER — BUPIVACAINE LIPOSOME 1.3 % IJ SUSP
INTRAMUSCULAR | Status: AC
  Filled 2021-07-10: qty 20

## 2021-07-10 MED ORDER — CHLORHEXIDINE GLUCONATE 0.12 % MT SOLN
15.0000 mL | Freq: Once | OROMUCOSAL | Status: AC
  Administered 2021-07-10: 15 mL via OROMUCOSAL

## 2021-07-10 SURGICAL SUPPLY — 27 items
BAG COUNTER SPONGE SURGICOUNT (BAG) ×1 IMPLANT
BLADE SURG 15 STRL LF DISP TIS (BLADE) ×1 IMPLANT
BLADE SURG 15 STRL SS (BLADE) ×2
BLADE SURG SZ10 CARB STEEL (BLADE) ×2 IMPLANT
DRAIN PENROSE 0.25X18 (DRAIN) IMPLANT
DRAPE LAPAROTOMY T 98X78 PEDS (DRAPES) ×2 IMPLANT
DRSG PAD ABDOMINAL 8X10 ST (GAUZE/BANDAGES/DRESSINGS) IMPLANT
ELECT REM PT RETURN 15FT ADLT (MISCELLANEOUS) ×2 IMPLANT
GAUZE 4X4 16PLY ~~LOC~~+RFID DBL (SPONGE) ×2 IMPLANT
GAUZE SPONGE 4X4 12PLY STRL (GAUZE/BANDAGES/DRESSINGS) ×1 IMPLANT
GLOVE BIO SURGEON STRL SZ 6.5 (GLOVE) ×2 IMPLANT
GLOVE BIOGEL PI IND STRL 7.0 (GLOVE) ×1 IMPLANT
GLOVE BIOGEL PI INDICATOR 7.0 (GLOVE) ×1
GLOVE INDICATOR 6.5 STRL GRN (GLOVE) ×2 IMPLANT
GOWN STRL REUS W/ TWL XL LVL3 (GOWN DISPOSABLE) ×2 IMPLANT
GOWN STRL REUS W/TWL XL LVL3 (GOWN DISPOSABLE) ×4
KIT BASIN OR (CUSTOM PROCEDURE TRAY) ×2 IMPLANT
KIT TURNOVER KIT A (KITS) ×2 IMPLANT
PACK LITHOTOMY IV (CUSTOM PROCEDURE TRAY) ×2 IMPLANT
PENCIL SMOKE EVACUATOR (MISCELLANEOUS) ×1 IMPLANT
SET IRRIG Y TYPE TUR BLADDER L (SET/KITS/TRAYS/PACK) ×2 IMPLANT
SURGILUBE 2OZ TUBE FLIPTOP (MISCELLANEOUS) ×2 IMPLANT
SUT SILK 2 0 (SUTURE)
SUT SILK 2-0 18XBRD TIE 12 (SUTURE) IMPLANT
TOWEL OR 17X26 10 PK STRL BLUE (TOWEL DISPOSABLE) ×2 IMPLANT
TOWEL OR NON WOVEN STRL DISP B (DISPOSABLE) ×2 IMPLANT
WATER STERILE IRR 500ML POUR (IV SOLUTION) ×2 IMPLANT

## 2021-07-10 NOTE — Interval H&P Note (Signed)
History and Physical Interval Note:  07/10/2021 9:50 AM  Ryan Rubio  has presented today for surgery, with the diagnosis of PROLAPSED HEMORRHOID.  The various methods of treatment have been discussed with the patient and family. After consideration of risks, benefits and other options for treatment, the patient has consented to  Procedure(s): HEMORRHOIDECTOMY PROLAPSED (N/A) RECTAL EXAM UNDER ANESTHESIA (N/A) as a surgical intervention.  The patient's history has been reviewed, patient examined, no change in status, stable for surgery.  I have reviewed the patient's chart and labs.  Questions were answered to the patient's satisfaction.  Creatinine elevated on patient's preop labs. I called his PCP, who relayed that this is stable compared to creatinine at his nephrology visit last month. Patient has known CKD.   Dwan Bolt

## 2021-07-10 NOTE — Anesthesia Postprocedure Evaluation (Signed)
Anesthesia Post Note  Patient: Ryan Rubio  Procedure(s) Performed: HEMORRHOIDECTOMY PROLAPSED RECTAL EXAM UNDER ANESTHESIA     Patient location during evaluation: PACU Anesthesia Type: General Level of consciousness: awake Pain management: pain level controlled Vital Signs Assessment: post-procedure vital signs reviewed and stable Respiratory status: spontaneous breathing Cardiovascular status: stable Postop Assessment: no apparent nausea or vomiting Anesthetic complications: no   No notable events documented.  Last Vitals:  Vitals:   07/10/21 1240 07/10/21 1300  BP: (!) 149/87 (!) 154/77  Pulse: (!) 56   Resp: 16   Temp:    SpO2: 100%     Last Pain:  Vitals:   07/10/21 1300  TempSrc:   PainSc: 0-No pain                 Huston Foley

## 2021-07-10 NOTE — Transfer of Care (Signed)
Immediate Anesthesia Transfer of Care Note  Patient: Ryan Rubio  Procedure(s) Performed: HEMORRHOIDECTOMY PROLAPSED RECTAL EXAM UNDER ANESTHESIA  Patient Location: PACU  Anesthesia Type:General  Level of Consciousness: drowsy and patient cooperative  Airway & Oxygen Therapy: Patient Spontanous Breathing and Patient connected to face mask oxygen  Post-op Assessment: Report given to RN and Post -op Vital signs reviewed and stable  Post vital signs: Reviewed and stable  Last Vitals:  Vitals Value Taken Time  BP    Temp    Pulse    Resp    SpO2      Last Pain:  Vitals:   07/10/21 0931  TempSrc:   PainSc: 3       Patients Stated Pain Goal: 2 (16/60/63 0160)  Complications: No notable events documented.

## 2021-07-10 NOTE — Discharge Instructions (Addendum)
[facility] DISCHARGE INSTRUCTIONS  Activity You should not drive for the first 3-4 days after surgery. Do not drive while taking narcotic pain medication. Avoid heavy lifting and straining.  Wound Care You may feel some sutures on the skin around the anus - these will dissolve and do not need to be removed. You may have some drainage and small amounts of bleeding after surgery. If you have persistent excessive bleeding, please call the office immediately or come to the emergency room. There is packing in your rectum that will fall out when you have your first bowel movement after surgery - this should not be replaced. You should perform sitz baths 3 times daily and after bowel movements - sit in several inches of warm water for 15-20 minutes to keep the area clean.  Medications Take the stool softeners and fiber as prescribed. It is very important to avoid constipation and straining with bowel movements. Drink plenty of water every day. Take the pain medication as prescribed. It is normal to have significant pain after hemorrhoidectomy.  When to Call us: Fever greater than 100.5 Persistent bleeding per rectum Difficulty urinating  Follow-up You have an appointment scheduled with Dr. Zenia Resides on July 26, 2021 at 1:50pm. This will be at the [facility] office at [address], [place], [place], [place]. Please arrive at least 15 minutes prior to your scheduled appointment time.  For questions or concerns, please call the office at [phone].

## 2021-07-10 NOTE — Anesthesia Procedure Notes (Signed)
Procedure Name: Intubation Date/Time: 07/10/2021 11:42 AM Performed by: Gwyndolyn Saxon, CRNA Pre-anesthesia Checklist: Patient identified, Emergency Drugs available, Suction available and Patient being monitored Patient Re-evaluated:Patient Re-evaluated prior to induction Oxygen Delivery Method: Circle system utilized Preoxygenation: Pre-oxygenation with 100% oxygen Induction Type: IV induction Ventilation: Mask ventilation without difficulty and Oral airway inserted - appropriate to patient size Laryngoscope Size: Sabra Heck and 2 Grade View: Grade I Tube type: Oral Tube size: 7.5 mm Number of attempts: 1 Airway Equipment and Method: Patient positioned with wedge pillow and Stylet Placement Confirmation: ETT inserted through vocal cords under direct vision, positive ETCO2 and breath sounds checked- equal and bilateral Secured at: 23 cm Tube secured with: Tape Dental Injury: Teeth and Oropharynx as per pre-operative assessment

## 2021-07-10 NOTE — Op Note (Signed)
Date: 07/10/21  Patient: Ryan Rubio MRN: [id number]  Preoperative Diagnosis: Grade III internal hemorrhoid Postoperative Diagnosis: Same  Procedure: Rectal exam under anesthesia, single-column hemorrhoidectomy  Surgeon: Michaelle Birks, MD  EBL: Minimal  Anesthesia: General  Specimens: Hemorrhoid  Indications: [name] is a 71 yo male with a symptomatic prolapsing internal hemorrhoid, which has not improved with medical management. Rectal exam in the office was very limited due to discomfort. He agreed to proceed with EUA and surgical hemorrhoidectomy. The risks and benefits were reviewed prior to surgery.  Findings: Significant enlargement and prolapse of the right anterior internal hemorrhoid with an associated external hemorrhoid. Very mild enlargement of the remaining two internal hemorrhoids. Excision of the right anterior hemorrhoid was performed.  Procedure details: Informed consent was obtained in the preoperative area prior to the procedure. The patient was brought to the operating room, general anesthesia was induced, and the patient was placed in the prone position. Perioperative antibiotics were administered per SCIP guidelines. The patient was prepped and draped in the usual sterile fashion. A pre-procedure timeout was taken verifying patient identity, surgical site and procedure to be performed.  A digital rectal exam was performed and there were no masses. A Hill-Ferguson retractor was inserted and the anal canal was examined. The right anterior hemorrhoid was enlarged and had spontaneously prolapsed, with a large associated external hemorrhoid. On initial exam in the office this had appeared to be to on the left side of the anal canal, but on EUA the right anterior hemorrhoid was clearly prolapsed. The right posterior hemorrhoid was only minimally enlarged and the left lateral internal hemorrhoid did not appear enlarged. An Donne Hazel was used to grasp the right anterior  hemorrhoid. The skin was incised beginning at the external hemorrhoid and working superiorly. The sphincter muscle was gently dissected downward to separate it from the overlying skin and mucosa. The hemorrhoid was then excised using the Ligasure, taking great care not to injure the external sphincter. The hemorrhoid was sent for routine pathology. The mucosal defect and skin were then closed with a running 3-0 chromic suture. The wound was hemostatic. A field block was performed with a 50/50 mixture of Exparel and 0.25% bupivicaine. A gelfoam pack was then placed in the rectum, followed by an external bulky gauze dressing.  The patient tolerated the procedure with no apparent complications. All counts were correct x2 at the end of the procedure. The patient was extubated and taken to PACU in stable condition.  Michaelle Birks, MD 07/10/21 12:32 PM

## 2021-07-10 NOTE — Anesthesia Preprocedure Evaluation (Signed)
Anesthesia Evaluation  Patient identified by MRN, date of birth, ID band Patient awake    Reviewed: Allergy & Precautions, NPO status , Patient's Chart, lab work & pertinent test results  Airway Mallampati: I       Dental no notable dental hx.    Pulmonary sleep apnea and Continuous Positive Airway Pressure Ventilation , former smoker,    Pulmonary exam normal        Cardiovascular hypertension, Pt. on medications Normal cardiovascular exam     Neuro/Psych negative neurological ROS  negative psych ROS   GI/Hepatic negative GI ROS, Neg liver ROS,   Endo/Other    Renal/GU Renal InsufficiencyRenal disease  negative genitourinary   Musculoskeletal   Abdominal Normal abdominal exam  (+)   Peds  Hematology  (+) Blood dyscrasia, anemia ,   Anesthesia Other Findings   Reproductive/Obstetrics                             Anesthesia Physical Anesthesia Plan  ASA: 2  Anesthesia Plan: General   Post-op Pain Management: Minimal or no pain anticipated   Induction: Intravenous  PONV Risk Score and Plan: 3 and Ondansetron, Dexamethasone and Midazolam  Airway Management Planned: Oral ETT  Additional Equipment: None  Intra-op Plan:   Post-operative Plan: Extubation in OR  Informed Consent: I have reviewed the patients History and Physical, chart, labs and discussed the procedure including the risks, benefits and alternatives for the proposed anesthesia with the patient or authorized representative who has indicated his/her understanding and acceptance.     Dental advisory given  Plan Discussed with: CRNA  Anesthesia Plan Comments:         Anesthesia Quick Evaluation

## 2021-07-11 ENCOUNTER — Encounter (HOSPITAL_COMMUNITY): Payer: Self-pay | Admitting: Surgery

## 2021-07-16 LAB — SURGICAL PATHOLOGY

## 2021-07-18 DIAGNOSIS — E785 Hyperlipidemia, unspecified: Secondary | ICD-10-CM | POA: Diagnosis not present

## 2021-07-18 DIAGNOSIS — R609 Edema, unspecified: Secondary | ICD-10-CM | POA: Diagnosis not present

## 2021-07-18 DIAGNOSIS — N042 Nephrotic syndrome with diffuse membranous glomerulonephritis: Secondary | ICD-10-CM | POA: Diagnosis not present

## 2021-07-18 DIAGNOSIS — I129 Hypertensive chronic kidney disease with stage 1 through stage 4 chronic kidney disease, or unspecified chronic kidney disease: Secondary | ICD-10-CM | POA: Diagnosis not present

## 2021-07-18 DIAGNOSIS — E559 Vitamin D deficiency, unspecified: Secondary | ICD-10-CM | POA: Diagnosis not present

## 2021-07-18 DIAGNOSIS — N1831 Chronic kidney disease, stage 3a: Secondary | ICD-10-CM | POA: Diagnosis not present

## 2021-08-06 DIAGNOSIS — Z0000 Encounter for general adult medical examination without abnormal findings: Secondary | ICD-10-CM | POA: Diagnosis not present

## 2021-08-06 DIAGNOSIS — E7800 Pure hypercholesterolemia, unspecified: Secondary | ICD-10-CM | POA: Diagnosis not present

## 2021-08-06 DIAGNOSIS — G4733 Obstructive sleep apnea (adult) (pediatric): Secondary | ICD-10-CM | POA: Diagnosis not present

## 2021-08-06 DIAGNOSIS — N042 Nephrotic syndrome with diffuse membranous glomerulonephritis: Secondary | ICD-10-CM | POA: Diagnosis not present

## 2021-08-06 DIAGNOSIS — I700 Atherosclerosis of aorta: Secondary | ICD-10-CM | POA: Diagnosis not present

## 2021-09-04 DIAGNOSIS — E7800 Pure hypercholesterolemia, unspecified: Secondary | ICD-10-CM | POA: Diagnosis not present

## 2021-09-16 DIAGNOSIS — N184 Chronic kidney disease, stage 4 (severe): Secondary | ICD-10-CM | POA: Diagnosis not present

## 2021-09-16 DIAGNOSIS — N1831 Chronic kidney disease, stage 3a: Secondary | ICD-10-CM | POA: Diagnosis not present

## 2021-09-16 DIAGNOSIS — E7800 Pure hypercholesterolemia, unspecified: Secondary | ICD-10-CM | POA: Diagnosis not present

## 2021-09-16 DIAGNOSIS — N042 Nephrotic syndrome with diffuse membranous glomerulonephritis: Secondary | ICD-10-CM | POA: Diagnosis not present

## 2021-09-16 DIAGNOSIS — I10 Essential (primary) hypertension: Secondary | ICD-10-CM | POA: Diagnosis not present

## 2021-09-16 DIAGNOSIS — I129 Hypertensive chronic kidney disease with stage 1 through stage 4 chronic kidney disease, or unspecified chronic kidney disease: Secondary | ICD-10-CM | POA: Diagnosis not present

## 2021-09-23 DIAGNOSIS — R609 Edema, unspecified: Secondary | ICD-10-CM | POA: Diagnosis not present

## 2021-09-23 DIAGNOSIS — E785 Hyperlipidemia, unspecified: Secondary | ICD-10-CM | POA: Diagnosis not present

## 2021-09-23 DIAGNOSIS — N042 Nephrotic syndrome with diffuse membranous glomerulonephritis: Secondary | ICD-10-CM | POA: Diagnosis not present

## 2021-09-23 DIAGNOSIS — N049 Nephrotic syndrome with unspecified morphologic changes: Secondary | ICD-10-CM | POA: Diagnosis not present

## 2021-09-23 DIAGNOSIS — N1831 Chronic kidney disease, stage 3a: Secondary | ICD-10-CM | POA: Diagnosis not present

## 2021-09-23 DIAGNOSIS — I129 Hypertensive chronic kidney disease with stage 1 through stage 4 chronic kidney disease, or unspecified chronic kidney disease: Secondary | ICD-10-CM | POA: Diagnosis not present

## 2021-09-23 DIAGNOSIS — E559 Vitamin D deficiency, unspecified: Secondary | ICD-10-CM | POA: Diagnosis not present

## 2021-10-24 DIAGNOSIS — L821 Other seborrheic keratosis: Secondary | ICD-10-CM | POA: Diagnosis not present

## 2021-10-24 DIAGNOSIS — L570 Actinic keratosis: Secondary | ICD-10-CM | POA: Diagnosis not present

## 2021-10-24 DIAGNOSIS — L814 Other melanin hyperpigmentation: Secondary | ICD-10-CM | POA: Diagnosis not present

## 2021-10-24 DIAGNOSIS — D225 Melanocytic nevi of trunk: Secondary | ICD-10-CM | POA: Diagnosis not present

## 2021-11-01 DIAGNOSIS — I10 Essential (primary) hypertension: Secondary | ICD-10-CM | POA: Diagnosis not present

## 2021-11-01 DIAGNOSIS — E7800 Pure hypercholesterolemia, unspecified: Secondary | ICD-10-CM | POA: Diagnosis not present

## 2021-12-16 DIAGNOSIS — N042 Nephrotic syndrome with diffuse membranous glomerulonephritis, unspecified: Secondary | ICD-10-CM | POA: Diagnosis not present

## 2021-12-23 DIAGNOSIS — N049 Nephrotic syndrome with unspecified morphologic changes: Secondary | ICD-10-CM | POA: Diagnosis not present

## 2021-12-23 DIAGNOSIS — E559 Vitamin D deficiency, unspecified: Secondary | ICD-10-CM | POA: Diagnosis not present

## 2021-12-23 DIAGNOSIS — I129 Hypertensive chronic kidney disease with stage 1 through stage 4 chronic kidney disease, or unspecified chronic kidney disease: Secondary | ICD-10-CM | POA: Diagnosis not present

## 2021-12-23 DIAGNOSIS — N1831 Chronic kidney disease, stage 3a: Secondary | ICD-10-CM | POA: Diagnosis not present

## 2021-12-23 DIAGNOSIS — N042 Nephrotic syndrome with diffuse membranous glomerulonephritis, unspecified: Secondary | ICD-10-CM | POA: Diagnosis not present

## 2022-02-19 DIAGNOSIS — U071 COVID-19: Secondary | ICD-10-CM | POA: Diagnosis not present

## 2022-02-27 ENCOUNTER — Telehealth: Payer: Self-pay | Admitting: *Deleted

## 2022-02-27 NOTE — Patient Outreach (Signed)
  Care Coordination   02/27/2022 Name: Ryan Rubio MRN: [id number] DOB: [birth year]   Care Coordination Outreach Attempts:  An unsuccessful telephone outreach was attempted today to offer the patient information about available care coordination services as a benefit of their health plan.   Follow Up Plan:  Additional outreach attempts will be made to offer the patient care coordination information and services.   Encounter Outcome:  No Answer   Care Coordination Interventions:  No, not indicated    Raina Mina, RN Care Management Coordinator [place] Office [phone]

## 2022-03-12 DIAGNOSIS — G4733 Obstructive sleep apnea (adult) (pediatric): Secondary | ICD-10-CM | POA: Diagnosis not present

## 2022-03-17 DIAGNOSIS — N183 Chronic kidney disease, stage 3 unspecified: Secondary | ICD-10-CM | POA: Diagnosis not present

## 2022-03-27 DIAGNOSIS — R600 Localized edema: Secondary | ICD-10-CM | POA: Diagnosis not present

## 2022-03-27 DIAGNOSIS — N042 Nephrotic syndrome with diffuse membranous glomerulonephritis, unspecified: Secondary | ICD-10-CM | POA: Diagnosis not present

## 2022-03-27 DIAGNOSIS — N1831 Chronic kidney disease, stage 3a: Secondary | ICD-10-CM | POA: Diagnosis not present

## 2022-03-27 DIAGNOSIS — E559 Vitamin D deficiency, unspecified: Secondary | ICD-10-CM | POA: Diagnosis not present

## 2022-03-27 DIAGNOSIS — I129 Hypertensive chronic kidney disease with stage 1 through stage 4 chronic kidney disease, or unspecified chronic kidney disease: Secondary | ICD-10-CM | POA: Diagnosis not present

## 2022-03-27 DIAGNOSIS — N049 Nephrotic syndrome with unspecified morphologic changes: Secondary | ICD-10-CM | POA: Diagnosis not present

## 2022-04-11 ENCOUNTER — Ambulatory Visit
Admission: RE | Admit: 2022-04-11 | Discharge: 2022-04-11 | Disposition: A | Discharge status: Home / Self Care | Payer: Medicare Other | Source: Ambulatory Visit | Attending: Family Medicine | Admitting: Family Medicine

## 2022-04-11 DIAGNOSIS — Z87891 Personal history of nicotine dependence: Secondary | ICD-10-CM

## 2022-04-14 ENCOUNTER — Telehealth: Payer: Self-pay | Admitting: *Deleted

## 2022-04-14 DIAGNOSIS — Z87891 Personal history of nicotine dependence: Secondary | ICD-10-CM

## 2022-04-14 DIAGNOSIS — Z122 Encounter for screening for malignant neoplasm of respiratory organs: Secondary | ICD-10-CM

## 2022-04-14 NOTE — Telephone Encounter (Signed)
PT ret your call for results Langley Gauss. Pls try again. [phone]

## 2022-04-14 NOTE — Telephone Encounter (Signed)
Left message for pt to call back regarding lung screening CT results.

## 2022-04-14 NOTE — Telephone Encounter (Signed)
Spoke with pt and advised of CT results. Stable lung nodules seen that will be followed at his yearly scan. Mild emphysema noted related to past smoking history. Coronary calcifications listed with recommendations for an echo. Pt states that he does not see cardiology and has not had an echo that he is aware of. He is on Pravastatin daily. I advised pt that I will send a copy of the ct report to his PCP Dr Lindell Noe to let her advise on an echo or cardiology referral. Pt verbalized understanding and is aware we will cal him closer to 12 months to repeat his next lung screening CT.

## 2022-04-27 IMAGING — US US RENAL
1 series · 14 of 25 positions shown · non-contrast
Comparison: None.

CLINICAL DATA: Chronic kidney disease.

EXAM:
RENAL / URINARY TRACT ULTRASOUND COMPLETE

[Series 1: us renal · 0.20mm/px · 14 of 35 slices shown]
[im 1/35]
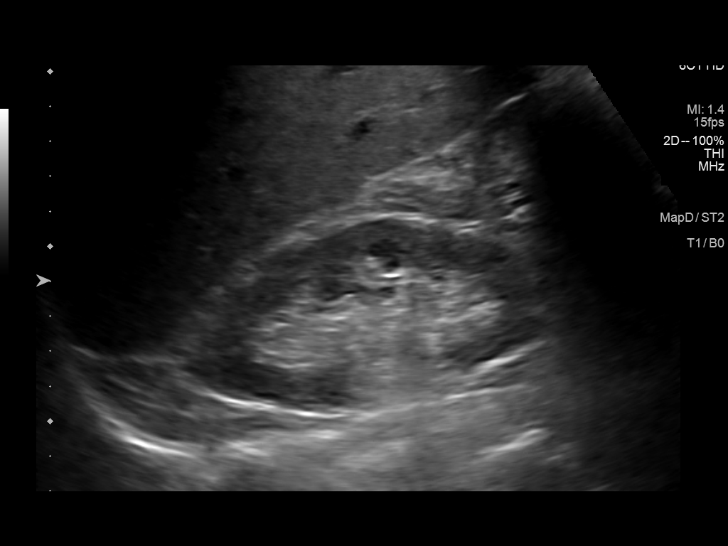
[im 3/35]
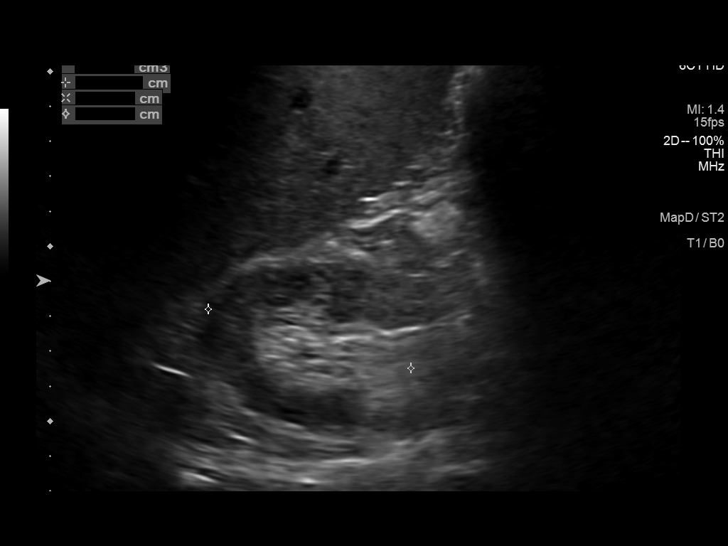
[im 6/35]
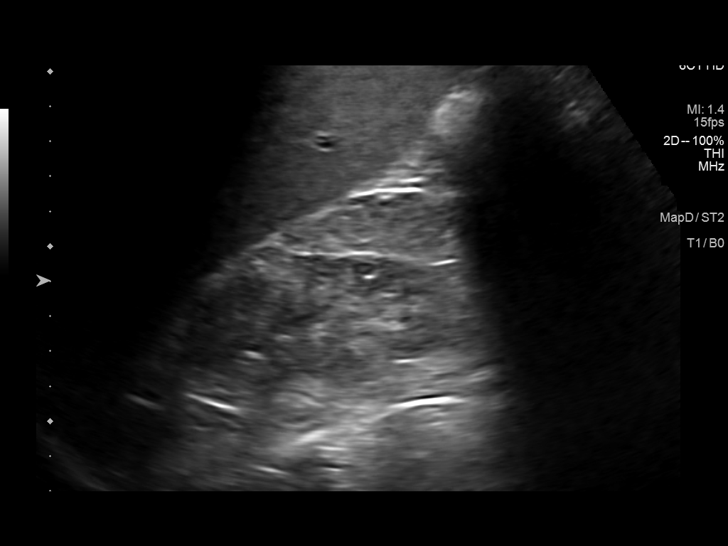
[im 9/35]
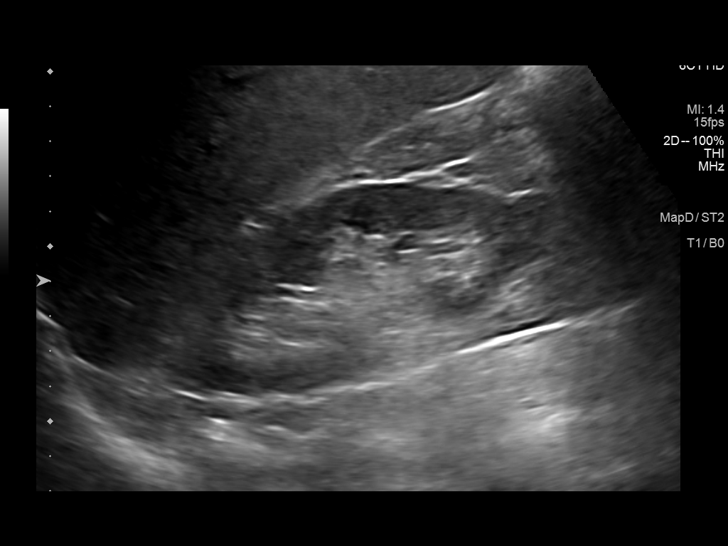
[im 12/35]
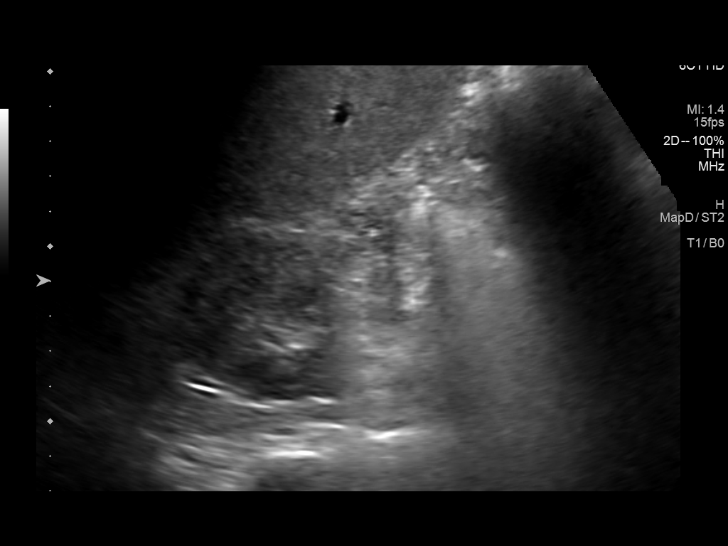
[im 13/35]
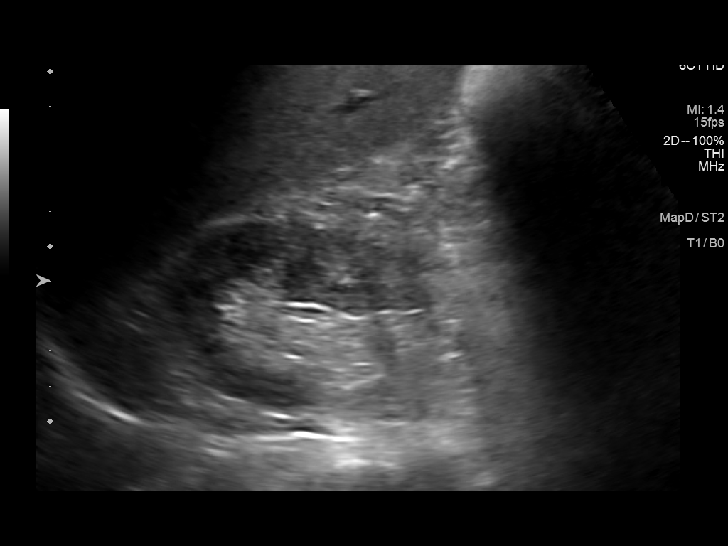
[im 16/35]
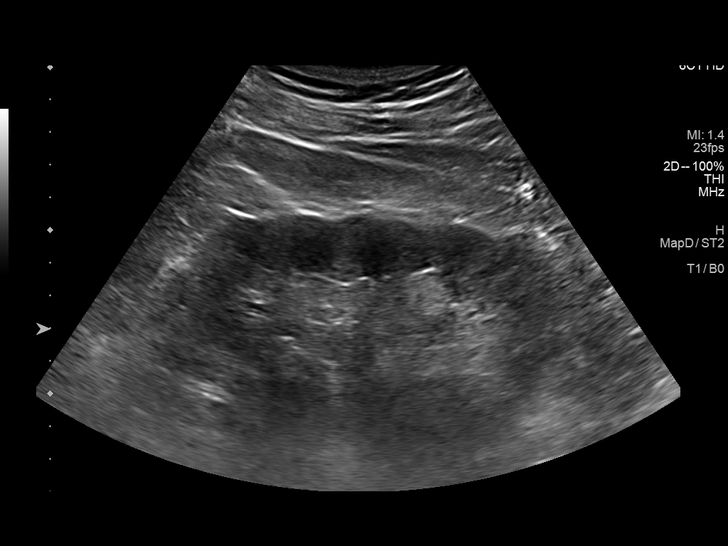
[im 19/35]
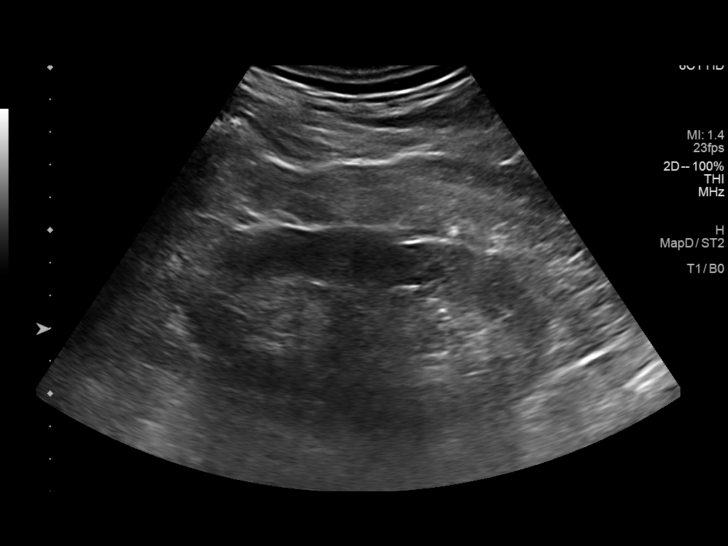
[im 22/35]
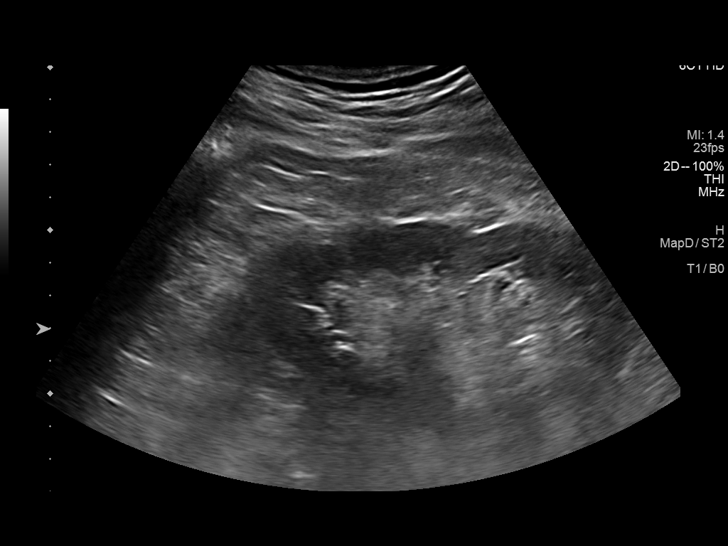
[im 23/35]
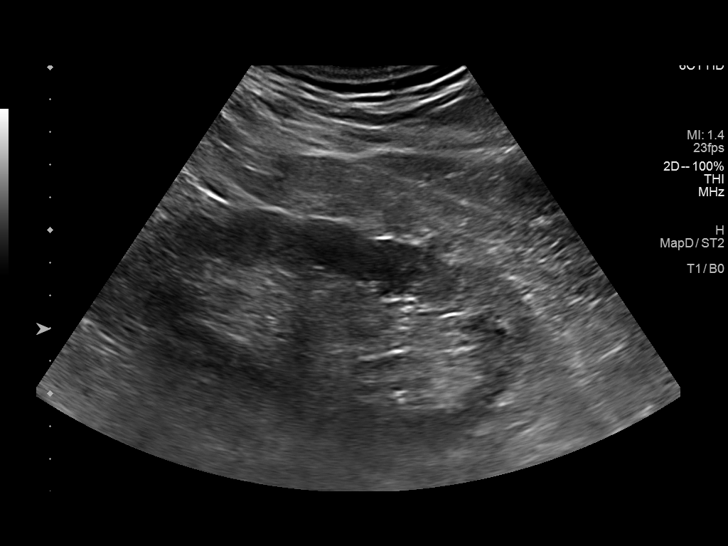
[im 26/35]
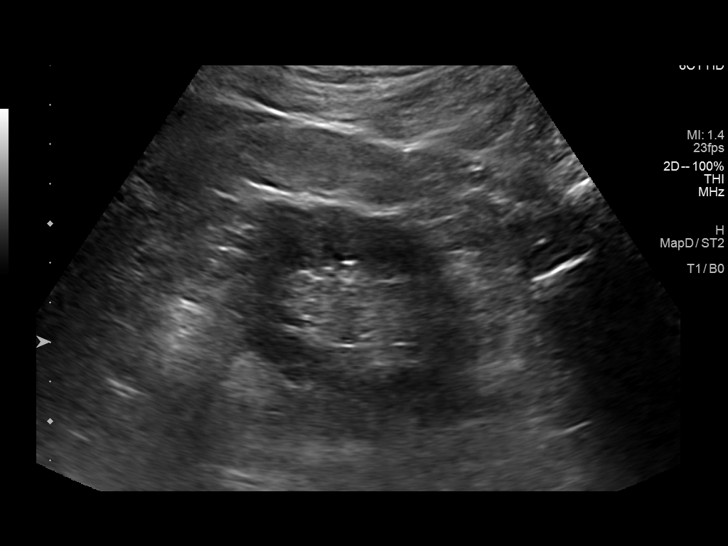
[im 29/35]
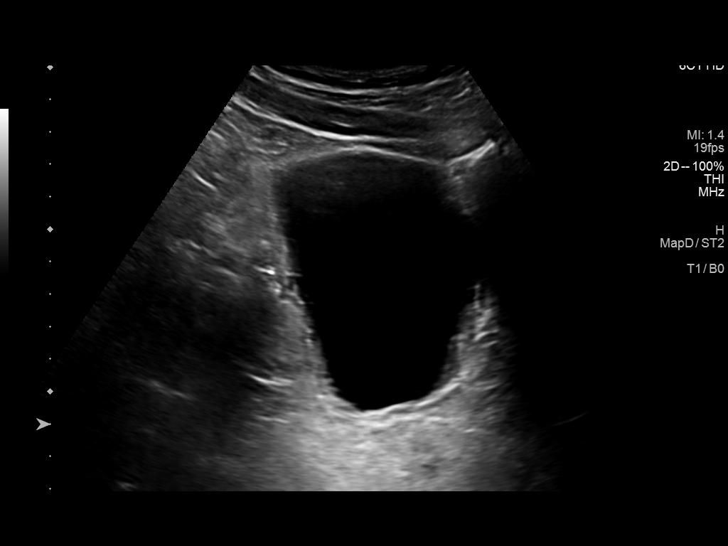
[im 32/35]
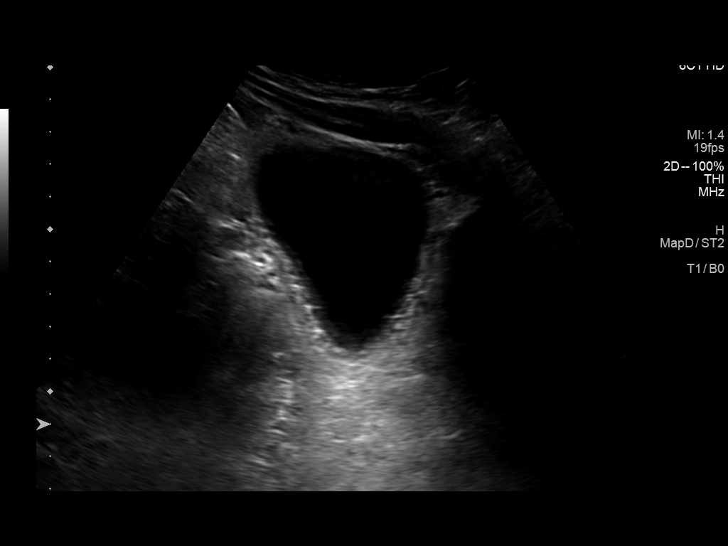
[im 35/35]
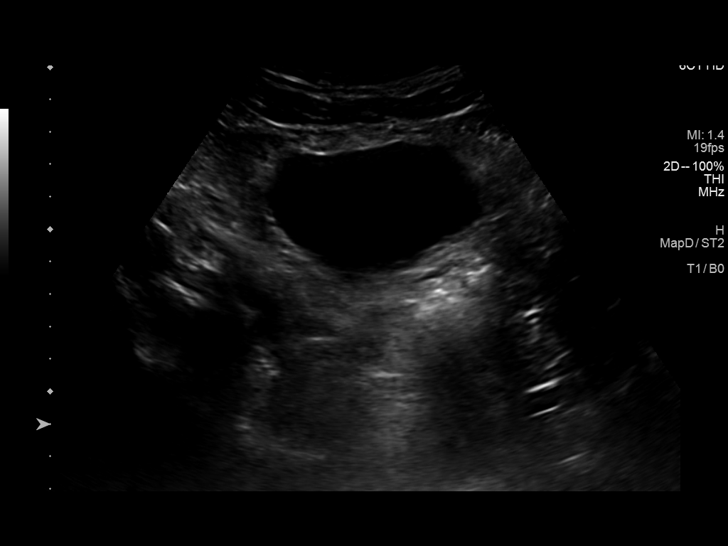

[14 of 25 positions shown; findings below may reference images not displayed]

FINDINGS: Right Kidney:

Renal measurements: 10.6 x 5.2 x 6.6 cm = volume: 175 mL.
Echogenicity within normal limits. No mass or hydronephrosis
visualized.

Left Kidney:

Renal measurements: 12.1 x 5.7 x 4.7 cm = volume: 169 mL.
Echogenicity within normal limits. No mass or hydronephrosis
visualized.

Bladder:

Appears normal for degree of bladder distention.

Other:

None.
IMPRESSION: No significant renal abnormality is noted.

## 2022-05-03 IMAGING — US US BIOPSY
1 series · 10 of 10 positions shown · non-contrast
Comparison: none

INDICATION: Proteinuria

[Series 1: us biopsy (kidney) · 10 acquisitions, 10 frames shown]
[im 1/10]
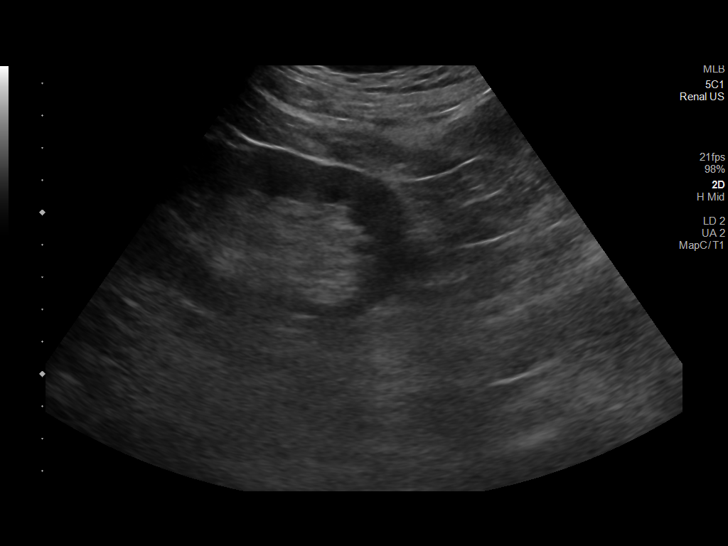
[im 2/10]
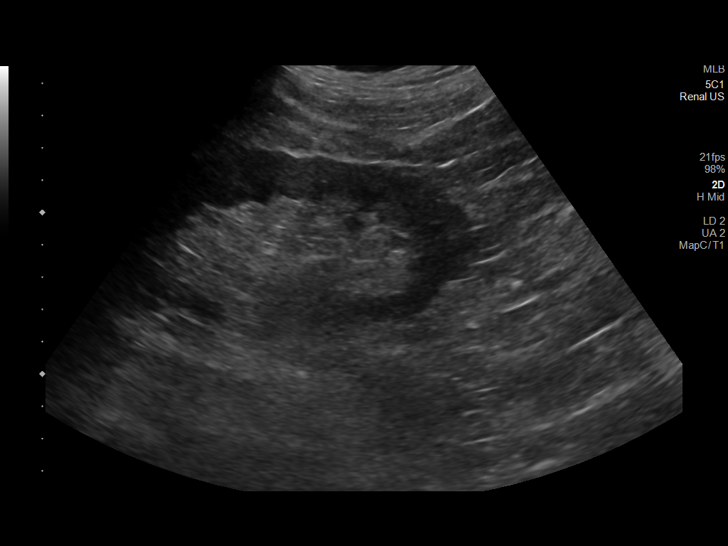
[im 3/10]
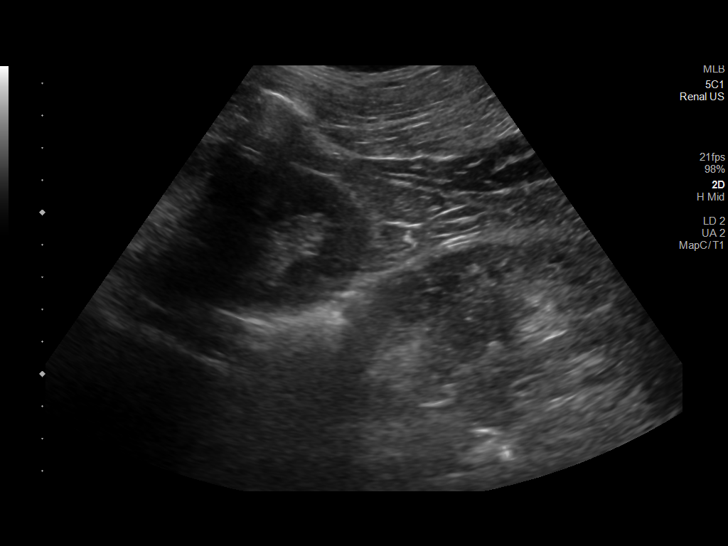
[im 4/10]
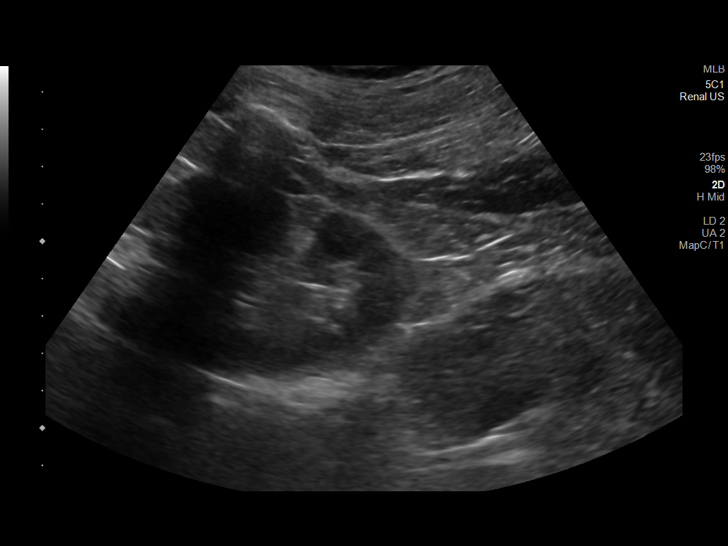
[im 5/10]
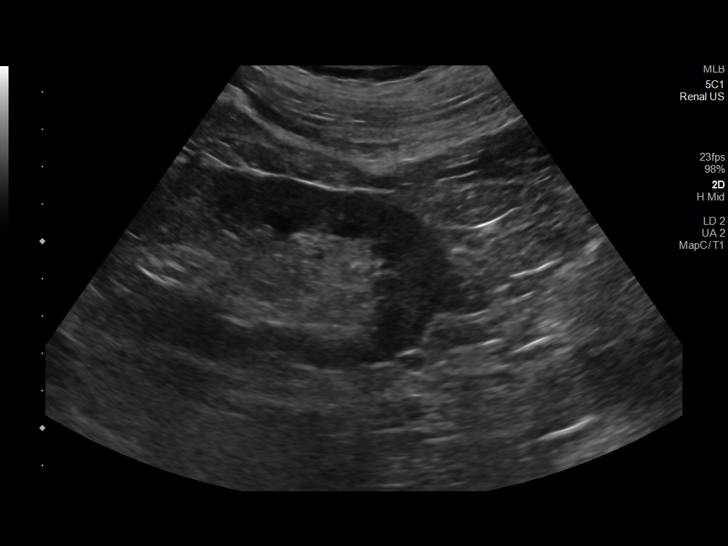
[im 6/10]
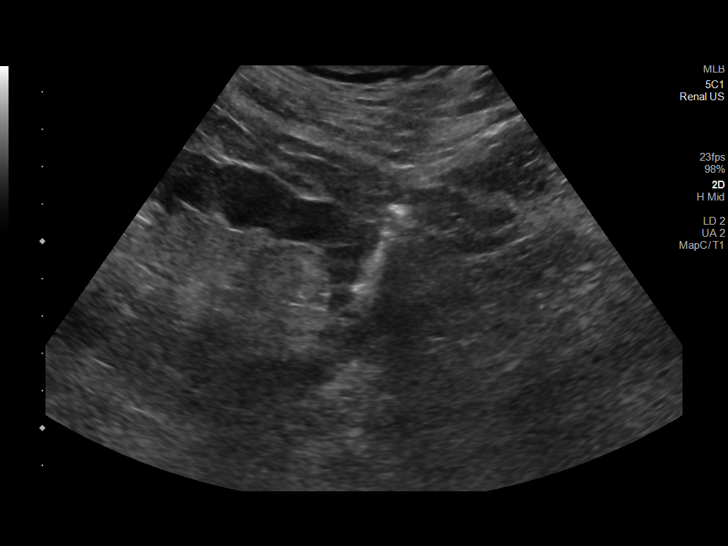
[im 7/10]
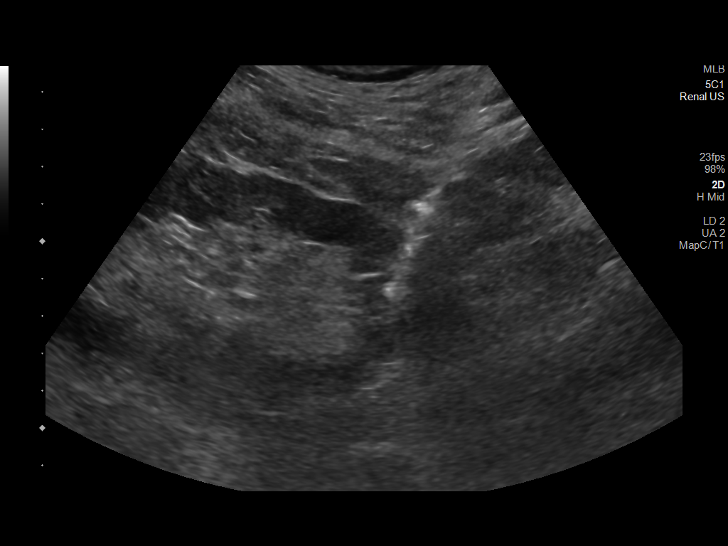
[im 8/10]
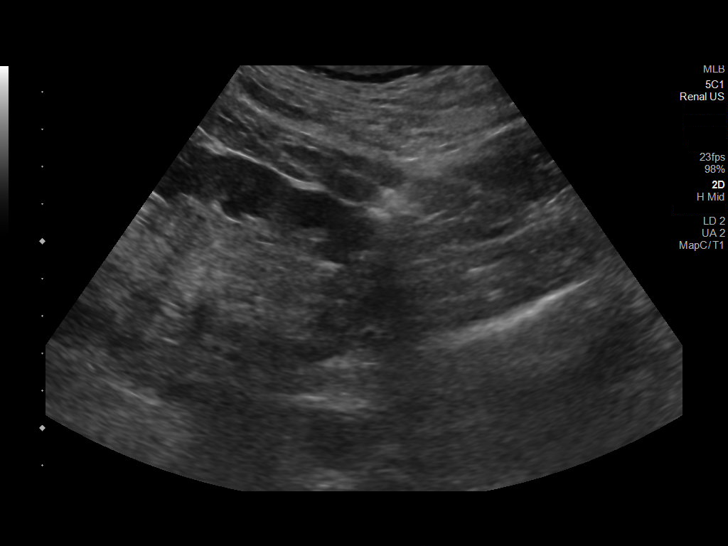
[im 9/10]
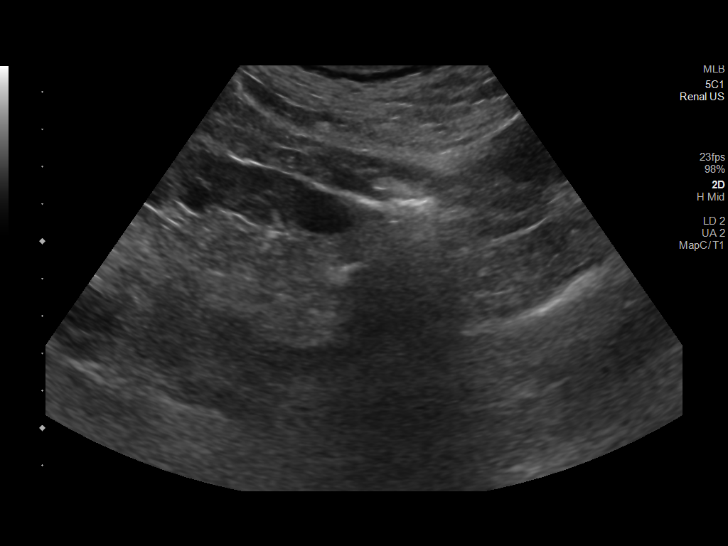
[im 10/10]
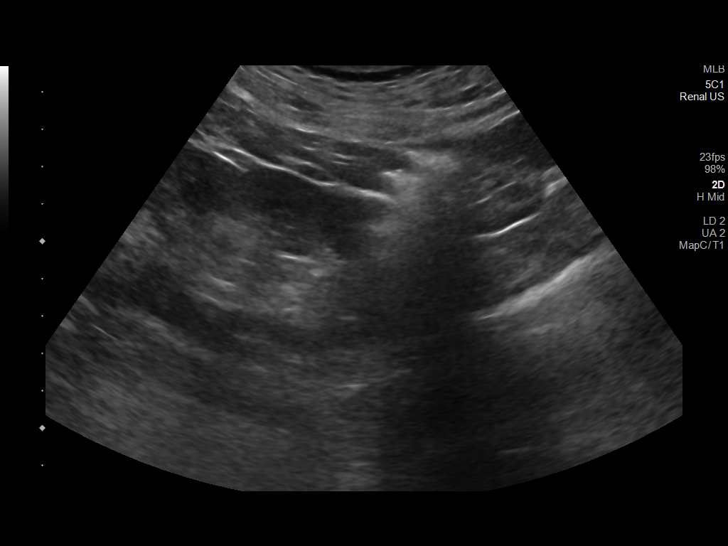

[10 of 10 positions shown; findings below may reference images not displayed]

EXAM:
ULTRASOUND GUIDED CORE BIOPSY OF LEFT KIDNEY

MEDICATIONS:
1% LIDOCAINE LOCAL

ANESTHESIA/SEDATION:
Versed 1.0mg IV; Fentanyl 03mcg IV;

Moderate Sedation Time:  10 MINUTES

The patient was continuously monitored during the procedure by the
interventional radiology nurse under my direct supervision.

FLUOROSCOPY TIME:  Fluoroscopy Time: None.

COMPLICATIONS:
None immediate.

PROCEDURE:
The procedure, risks, benefits, and alternatives were explained to
the patient. Questions regarding the procedure were encouraged and
answered. The patient understands and consents to the procedure.

The left flank was prepped with ChloraPrep in a sterile fashion, and
a sterile drape was applied covering the operative field. A sterile
gown and sterile gloves were used for the procedure. Local
anesthesia was provided with 1% Lidocaine.

Previous imaging reviewed. Patient position prone. Preliminary
ultrasound performed. Left kidney lower pole was localized and
marked.

Under sterile conditions and local anesthesia, a 15 gauge coaxial
guide was advanced to the left kidney lower pole. Needle position
confirmed with ultrasound. Images obtained for documentation.
Through the access, a 2 16 gauge core biopsies obtained of the
cortex. Needle position confirmed with ultrasound. Images again
obtained for documentation. Samples were intact and non fragmented.
These were placed in saline. Needle tract occluded with Gel-Foam. No
immediate complication. Patient tolerated the procedure well.
FINDINGS: Imaging confirms needle placement to the left kidney lower pole
cortex for core biopsy
IMPRESSION: Successful ultrasound left kidney random core biopsy

## 2022-05-26 DIAGNOSIS — I10 Essential (primary) hypertension: Secondary | ICD-10-CM | POA: Diagnosis not present

## 2022-05-26 DIAGNOSIS — R931 Abnormal findings on diagnostic imaging of heart and coronary circulation: Secondary | ICD-10-CM | POA: Diagnosis not present

## 2022-05-26 DIAGNOSIS — E7800 Pure hypercholesterolemia, unspecified: Secondary | ICD-10-CM | POA: Diagnosis not present

## 2022-07-21 DIAGNOSIS — E559 Vitamin D deficiency, unspecified: Secondary | ICD-10-CM | POA: Diagnosis not present

## 2022-07-21 DIAGNOSIS — N042 Nephrotic syndrome with diffuse membranous glomerulonephritis, unspecified: Secondary | ICD-10-CM | POA: Diagnosis not present

## 2022-08-06 DIAGNOSIS — N042 Nephrotic syndrome with diffuse membranous glomerulonephritis, unspecified: Secondary | ICD-10-CM | POA: Diagnosis not present

## 2022-08-06 DIAGNOSIS — N1831 Chronic kidney disease, stage 3a: Secondary | ICD-10-CM | POA: Diagnosis not present

## 2022-08-06 DIAGNOSIS — I129 Hypertensive chronic kidney disease with stage 1 through stage 4 chronic kidney disease, or unspecified chronic kidney disease: Secondary | ICD-10-CM | POA: Diagnosis not present

## 2022-08-06 DIAGNOSIS — E559 Vitamin D deficiency, unspecified: Secondary | ICD-10-CM | POA: Diagnosis not present

## 2022-08-06 DIAGNOSIS — R600 Localized edema: Secondary | ICD-10-CM | POA: Diagnosis not present

## 2022-08-18 DIAGNOSIS — N042 Nephrotic syndrome with diffuse membranous glomerulonephritis, unspecified: Secondary | ICD-10-CM | POA: Diagnosis not present

## 2022-08-18 DIAGNOSIS — I10 Essential (primary) hypertension: Secondary | ICD-10-CM | POA: Diagnosis not present

## 2022-08-18 DIAGNOSIS — R7301 Impaired fasting glucose: Secondary | ICD-10-CM | POA: Diagnosis not present

## 2022-08-18 DIAGNOSIS — E7800 Pure hypercholesterolemia, unspecified: Secondary | ICD-10-CM | POA: Diagnosis not present

## 2022-08-18 DIAGNOSIS — I350 Nonrheumatic aortic (valve) stenosis: Secondary | ICD-10-CM | POA: Diagnosis not present

## 2022-08-18 DIAGNOSIS — I700 Atherosclerosis of aorta: Secondary | ICD-10-CM | POA: Diagnosis not present

## 2022-08-18 DIAGNOSIS — G4733 Obstructive sleep apnea (adult) (pediatric): Secondary | ICD-10-CM | POA: Diagnosis not present

## 2022-08-18 DIAGNOSIS — Z0000 Encounter for general adult medical examination without abnormal findings: Secondary | ICD-10-CM | POA: Diagnosis not present

## 2022-08-18 DIAGNOSIS — L739 Follicular disorder, unspecified: Secondary | ICD-10-CM | POA: Diagnosis not present

## 2022-08-18 DIAGNOSIS — Z79899 Other long term (current) drug therapy: Secondary | ICD-10-CM | POA: Diagnosis not present

## 2022-08-18 DIAGNOSIS — M79672 Pain in left foot: Secondary | ICD-10-CM | POA: Diagnosis not present

## 2022-08-28 DIAGNOSIS — N052 Unspecified nephritic syndrome with diffuse membranous glomerulonephritis: Secondary | ICD-10-CM | POA: Diagnosis not present

## 2022-09-11 DIAGNOSIS — N052 Unspecified nephritic syndrome with diffuse membranous glomerulonephritis: Secondary | ICD-10-CM | POA: Diagnosis not present

## 2022-10-01 DIAGNOSIS — N1831 Chronic kidney disease, stage 3a: Secondary | ICD-10-CM | POA: Diagnosis not present

## 2022-10-02 DIAGNOSIS — J438 Other emphysema: Secondary | ICD-10-CM | POA: Diagnosis not present

## 2022-10-02 DIAGNOSIS — R053 Chronic cough: Secondary | ICD-10-CM | POA: Diagnosis not present

## 2022-10-10 DIAGNOSIS — N1831 Chronic kidney disease, stage 3a: Secondary | ICD-10-CM | POA: Diagnosis not present

## 2022-10-10 DIAGNOSIS — E559 Vitamin D deficiency, unspecified: Secondary | ICD-10-CM | POA: Diagnosis not present

## 2022-10-10 DIAGNOSIS — N042 Nephrotic syndrome with diffuse membranous glomerulonephritis, unspecified: Secondary | ICD-10-CM | POA: Diagnosis not present

## 2022-10-10 DIAGNOSIS — N049 Nephrotic syndrome with unspecified morphologic changes: Secondary | ICD-10-CM | POA: Diagnosis not present

## 2022-10-10 DIAGNOSIS — R600 Localized edema: Secondary | ICD-10-CM | POA: Diagnosis not present

## 2022-10-10 DIAGNOSIS — I129 Hypertensive chronic kidney disease with stage 1 through stage 4 chronic kidney disease, or unspecified chronic kidney disease: Secondary | ICD-10-CM | POA: Diagnosis not present

## 2022-12-09 DIAGNOSIS — N1831 Chronic kidney disease, stage 3a: Secondary | ICD-10-CM | POA: Diagnosis not present

## 2022-12-16 DIAGNOSIS — E559 Vitamin D deficiency, unspecified: Secondary | ICD-10-CM | POA: Diagnosis not present

## 2022-12-16 DIAGNOSIS — R600 Localized edema: Secondary | ICD-10-CM | POA: Diagnosis not present

## 2022-12-16 DIAGNOSIS — N042 Nephrotic syndrome with diffuse membranous glomerulonephritis, unspecified: Secondary | ICD-10-CM | POA: Diagnosis not present

## 2022-12-16 DIAGNOSIS — N1831 Chronic kidney disease, stage 3a: Secondary | ICD-10-CM | POA: Diagnosis not present

## 2022-12-16 DIAGNOSIS — I129 Hypertensive chronic kidney disease with stage 1 through stage 4 chronic kidney disease, or unspecified chronic kidney disease: Secondary | ICD-10-CM | POA: Diagnosis not present

## 2022-12-16 DIAGNOSIS — N049 Nephrotic syndrome with unspecified morphologic changes: Secondary | ICD-10-CM | POA: Diagnosis not present

## 2023-03-02 DIAGNOSIS — N042 Nephrotic syndrome with diffuse membranous glomerulonephritis, unspecified: Secondary | ICD-10-CM | POA: Diagnosis not present

## 2023-03-02 DIAGNOSIS — N052 Unspecified nephritic syndrome with diffuse membranous glomerulonephritis: Secondary | ICD-10-CM | POA: Diagnosis not present

## 2023-03-17 DIAGNOSIS — I10 Essential (primary) hypertension: Secondary | ICD-10-CM | POA: Diagnosis not present

## 2023-03-17 DIAGNOSIS — G4733 Obstructive sleep apnea (adult) (pediatric): Secondary | ICD-10-CM | POA: Diagnosis not present

## 2023-04-07 DIAGNOSIS — R519 Headache, unspecified: Secondary | ICD-10-CM | POA: Diagnosis not present

## 2023-04-07 DIAGNOSIS — N042 Nephrotic syndrome with diffuse membranous glomerulonephritis, unspecified: Secondary | ICD-10-CM | POA: Diagnosis not present

## 2023-04-13 DIAGNOSIS — N1831 Chronic kidney disease, stage 3a: Secondary | ICD-10-CM | POA: Diagnosis not present

## 2023-04-14 ENCOUNTER — Ambulatory Visit
Admission: RE | Admit: 2023-04-14 | Discharge: 2023-04-14 | Disposition: A | Discharge status: Home / Self Care | Payer: Medicare Other | Source: Ambulatory Visit | Attending: Family Medicine

## 2023-04-14 DIAGNOSIS — Z122 Encounter for screening for malignant neoplasm of respiratory organs: Secondary | ICD-10-CM | POA: Diagnosis not present

## 2023-04-14 DIAGNOSIS — Z87891 Personal history of nicotine dependence: Secondary | ICD-10-CM | POA: Diagnosis not present

## 2023-04-14 NOTE — Progress Notes (Signed)
 Former smoker x 11 years 69 pack year  Asymptomatic

## 2023-04-24 DIAGNOSIS — I129 Hypertensive chronic kidney disease with stage 1 through stage 4 chronic kidney disease, or unspecified chronic kidney disease: Secondary | ICD-10-CM | POA: Diagnosis not present

## 2023-04-24 DIAGNOSIS — N049 Nephrotic syndrome with unspecified morphologic changes: Secondary | ICD-10-CM | POA: Diagnosis not present

## 2023-04-24 DIAGNOSIS — N042 Nephrotic syndrome with diffuse membranous glomerulonephritis, unspecified: Secondary | ICD-10-CM | POA: Diagnosis not present

## 2023-04-24 DIAGNOSIS — R600 Localized edema: Secondary | ICD-10-CM | POA: Diagnosis not present

## 2023-04-24 DIAGNOSIS — N1831 Chronic kidney disease, stage 3a: Secondary | ICD-10-CM | POA: Diagnosis not present

## 2023-04-24 DIAGNOSIS — E559 Vitamin D deficiency, unspecified: Secondary | ICD-10-CM | POA: Diagnosis not present

## 2023-04-24 DIAGNOSIS — I10 Essential (primary) hypertension: Secondary | ICD-10-CM | POA: Diagnosis not present

## 2023-05-11 ENCOUNTER — Other Ambulatory Visit: Payer: Self-pay

## 2023-05-11 DIAGNOSIS — Z87891 Personal history of nicotine dependence: Secondary | ICD-10-CM

## 2023-05-11 DIAGNOSIS — Z122 Encounter for screening for malignant neoplasm of respiratory organs: Secondary | ICD-10-CM

## 2023-05-12 DIAGNOSIS — N183 Chronic kidney disease, stage 3 unspecified: Secondary | ICD-10-CM | POA: Diagnosis not present

## 2023-05-12 DIAGNOSIS — B351 Tinea unguium: Secondary | ICD-10-CM | POA: Diagnosis not present

## 2023-06-09 DIAGNOSIS — B351 Tinea unguium: Secondary | ICD-10-CM | POA: Diagnosis not present

## 2023-06-24 ENCOUNTER — Encounter: Payer: Self-pay | Admitting: Neurology

## 2023-07-08 DIAGNOSIS — H43393 Other vitreous opacities, bilateral: Secondary | ICD-10-CM | POA: Diagnosis not present

## 2023-07-08 NOTE — Progress Notes (Signed)
 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  Also consider V1 trigeminal neuralgia  At this time, it is not frequent enough to warrant starting a preventative medication.  Consider gabapentin  if needed.  He will contact me if we need to do that.  Total time spent in chart and face to face with patient:  37 minutes   Subjective:  Ryan Rubio is a 73 year old right-handed male with membranous glomerulonephritis, HLD, and OSA on CPAP who presents for headache.  History supplemented by referring provider's note.  Several years ago, he hit the right parietal region of his head on the corner of a cabinet.  Nothing severe.  No loss of consciousness.  Since then he has had pain over the right frontal/parietal region.  He has periods of pain for a few weeks every 3 to 4 months.  It is either a sharp severe lightening pain that lasts 5-6 seconds and occurs 1 to 2 days a week.  Sometimes that scalp over that area is sore and just a light touch or brushing of the scalp may become extremely painful.  No associated papular pain in region to suggest shingles.  He and his PCP inquire about Botox or trigger point injections.    PAST MEDICAL HISTORY: Past Medical History:  Diagnosis Date   Arthritis    Chronic kidney disease    Hypertension    Sleep apnea    cpap- settings at20     PAST SURGICAL HISTORY: Past Surgical History:  Procedure Laterality Date   COLONOSCOPY     x 2   HEMORRHOID SURGERY N/A 07/10/2021   Procedure: HEMORRHOIDECTOMY PROLAPSED;  Surgeon: Lujean Sake, MD;  Location: [facility];  Service: General;  Laterality: N/A;   RECTAL EXAM UNDER ANESTHESIA N/A 07/10/2021   Procedure: RECTAL EXAM UNDER ANESTHESIA;  Surgeon: Lujean Sake, MD;  Location: [facility];  Service: General;  Laterality: N/A;   TOTAL KNEE ARTHROPLASTY Right 04/11/2016   Procedure: RIGHT TOTAL KNEE ARTHROPLASTY;  Surgeon: Genevie Kerns, MD;  Location: [facility];  Service: Orthopedics;  Laterality: Right;  Adductor Block    MEDICATIONS: Current Outpatient Medications on File Prior to Visit  Medication Sig Dispense Refill   cholecalciferol (VITAMIN D) 25 MCG (1000 UNIT) tablet Take 2,000 Units by mouth in the morning and at bedtime.     cholecalciferol (VITAMIN D3) 25 MCG (1000 UNIT) tablet Take 1,000 Units by mouth daily.     FARXIGA 10 MG TABS tablet Take 10 mg by mouth in the morning.     losartan (COZAAR) 50 MG tablet Take 50 mg by mouth in the morning and at bedtime.     oxyCODONE  (OXY IR/ROXICODONE ) 5 MG immediate release tablet Take 1 tablet (5 mg total) by mouth every 4 (four) hours as needed for severe pain. 20 tablet 0   polyethylene glycol (MIRALAX ) 17 g packet Take 17 g by mouth daily as needed for mild constipation or moderate constipation. 14 each 0   pravastatin  (PRAVACHOL ) 40 MG tablet Take 40 mg by mouth in the morning.     Probiotic Product (ALIGN PO) Take 1 capsule by mouth every other day. In the morning.     riTUXimab  (RITUXAN  IV) Inject 1 Dose into the vein as directed. Every 6 to 8 months  No current facility-administered medications on file prior to visit.    ALLERGIES: No Known Allergies  FAMILY HISTORY: No family history on file.  Objective:  Blood pressure (!) 102/50, pulse 64, height 5\' 10"  (1.778 m), weight 166 lb (75.3 kg), SpO2 99%. General: No acute distress.  Patient appears well-groomed.   Head:  Normocephalic/atraumatic Eyes:  fundi examined but not visualized Neck: supple, no paraspinal tenderness, full range of motion Back: No paraspinal tenderness Heart: regular rate and rhythm Lungs: Clear to auscultation bilaterally. Vascular: No carotid bruits. Neurological Exam: Mental status: alert and oriented to person, place, and  time, speech fluent and not dysarthric, language intact. Cranial nerves: CN I: not tested CN II: pupils equal, round and reactive to light, visual fields intact CN III, IV, VI:  full range of motion, no nystagmus, no ptosis CN V: facial sensation intact. CN VII: upper and lower face symmetric CN VIII: hearing intact CN IX, X: gag intact, uvula midline CN XI: sternocleidomastoid and trapezius muscles intact CN XII: tongue midline Bulk & Tone: normal, no fasciculations. Motor:  muscle strength 5/5 throughout Sensation:  Pinprick, temperature and vibratory sensation intact. Deep Tendon Reflexes:  2+ throughout,  toes downgoing.   Finger to nose testing:  Without dysmetria.   Heel to shin:  Without dysmetria.   Gait:  Normal station and stride.  Romberg negative.    Thank you for allowing me to take part in the care of this patient.  Janne Members, DO  CC: Denna Fish, MD

## 2023-07-10 ENCOUNTER — Encounter: Payer: Self-pay | Admitting: Neurology

## 2023-07-10 ENCOUNTER — Ambulatory Visit (INDEPENDENT_AMBULATORY_CARE_PROVIDER_SITE_OTHER): Admitting: Neurology

## 2023-07-10 VITALS — BP 102/50 | HR 64 | Ht 70.0 in | Wt 166.0 lb

## 2023-07-10 DIAGNOSIS — M792 Neuralgia and neuritis, unspecified: Secondary | ICD-10-CM | POA: Diagnosis not present

## 2023-07-10 NOTE — Patient Instructions (Signed)
 Likely the nerve over the scalp is irritated from the bump.  May be irritation of the trigeminal nerve to the head.  Treatment would be daily seizure medication but I would only start if it is more frequent.  Contact me if it increases.  Does not seem like aneurysm or brain tumor

## 2023-07-22 DIAGNOSIS — W57XXXA Bitten or stung by nonvenomous insect and other nonvenomous arthropods, initial encounter: Secondary | ICD-10-CM | POA: Diagnosis not present

## 2023-07-22 DIAGNOSIS — S20461A Insect bite (nonvenomous) of right back wall of thorax, initial encounter: Secondary | ICD-10-CM | POA: Diagnosis not present

## 2023-07-22 DIAGNOSIS — M792 Neuralgia and neuritis, unspecified: Secondary | ICD-10-CM | POA: Diagnosis not present

## 2023-07-22 DIAGNOSIS — B351 Tinea unguium: Secondary | ICD-10-CM | POA: Diagnosis not present

## 2023-08-31 DIAGNOSIS — N052 Unspecified nephritic syndrome with diffuse membranous glomerulonephritis: Secondary | ICD-10-CM | POA: Diagnosis not present

## 2023-09-02 DIAGNOSIS — Z79899 Other long term (current) drug therapy: Secondary | ICD-10-CM | POA: Diagnosis not present

## 2023-09-02 DIAGNOSIS — I350 Nonrheumatic aortic (valve) stenosis: Secondary | ICD-10-CM | POA: Diagnosis not present

## 2023-09-02 DIAGNOSIS — Z23 Encounter for immunization: Secondary | ICD-10-CM | POA: Diagnosis not present

## 2023-09-02 DIAGNOSIS — E7800 Pure hypercholesterolemia, unspecified: Secondary | ICD-10-CM | POA: Diagnosis not present

## 2023-09-02 DIAGNOSIS — I10 Essential (primary) hypertension: Secondary | ICD-10-CM | POA: Diagnosis not present

## 2023-09-02 DIAGNOSIS — G4733 Obstructive sleep apnea (adult) (pediatric): Secondary | ICD-10-CM | POA: Diagnosis not present

## 2023-09-02 DIAGNOSIS — N0429 Other nephrotic syndrome with diffuse membranous glomerulonephritis: Secondary | ICD-10-CM | POA: Diagnosis not present

## 2023-09-02 DIAGNOSIS — R7301 Impaired fasting glucose: Secondary | ICD-10-CM | POA: Diagnosis not present

## 2023-09-02 DIAGNOSIS — Z0000 Encounter for general adult medical examination without abnormal findings: Secondary | ICD-10-CM | POA: Diagnosis not present

## 2023-12-18 DIAGNOSIS — N1831 Chronic kidney disease, stage 3a: Secondary | ICD-10-CM | POA: Diagnosis not present

## 2023-12-23 DIAGNOSIS — N049 Nephrotic syndrome with unspecified morphologic changes: Secondary | ICD-10-CM | POA: Diagnosis not present

## 2023-12-23 DIAGNOSIS — I10 Essential (primary) hypertension: Secondary | ICD-10-CM | POA: Diagnosis not present

## 2023-12-23 DIAGNOSIS — N042 Nephrotic syndrome with diffuse membranous glomerulonephritis, unspecified: Secondary | ICD-10-CM | POA: Diagnosis not present

## 2023-12-23 DIAGNOSIS — E559 Vitamin D deficiency, unspecified: Secondary | ICD-10-CM | POA: Diagnosis not present

## 2023-12-23 DIAGNOSIS — R600 Localized edema: Secondary | ICD-10-CM | POA: Diagnosis not present

## 2023-12-23 DIAGNOSIS — N1831 Chronic kidney disease, stage 3a: Secondary | ICD-10-CM | POA: Diagnosis not present

## 2024-03-18 ENCOUNTER — Telehealth: Payer: Self-pay

## 2024-03-18 NOTE — Telephone Encounter (Signed)
 LVM to call and schedule annual Lung Screening.

## 2024-04-14 ENCOUNTER — Other Ambulatory Visit
